# Patient Record
Sex: Female | Born: 1945 | Race: White | Hispanic: No | Marital: Married | Smoking: Never smoker
Health system: Southern US, Community
[De-identification: ages and names within clinical notes are randomized; demographics above are authoritative.]

## PROBLEM LIST (undated history)

## (undated) DIAGNOSIS — M8580 Other specified disorders of bone density and structure, unspecified site: Secondary | ICD-10-CM

## (undated) DIAGNOSIS — T7840XA Allergy, unspecified, initial encounter: Secondary | ICD-10-CM

## (undated) DIAGNOSIS — I10 Essential (primary) hypertension: Secondary | ICD-10-CM

## (undated) DIAGNOSIS — T8859XA Other complications of anesthesia, initial encounter: Secondary | ICD-10-CM

## (undated) DIAGNOSIS — E785 Hyperlipidemia, unspecified: Secondary | ICD-10-CM

## (undated) HISTORY — PX: OTHER SURGICAL HISTORY: SHX169

## (undated) HISTORY — PX: TUBAL LIGATION: SHX77

## (undated) HISTORY — PX: WISDOM TOOTH EXTRACTION: SHX21

## (undated) HISTORY — DX: Allergy, unspecified, initial encounter: T78.40XA

## (undated) HISTORY — PX: POLYPECTOMY: SHX149

## (undated) HISTORY — DX: Hyperlipidemia, unspecified: E78.5

## (undated) HISTORY — DX: Other specified disorders of bone density and structure, unspecified site: M85.80

## (undated) HISTORY — DX: Essential (primary) hypertension: I10

---

## 2004-05-29 ENCOUNTER — Encounter: Payer: Self-pay | Admitting: Family Medicine

## 2004-05-29 LAB — CONVERTED CEMR LAB

## 2005-01-02 ENCOUNTER — Ambulatory Visit: Payer: Self-pay | Admitting: Family Medicine

## 2005-06-05 ENCOUNTER — Ambulatory Visit: Payer: Self-pay | Admitting: Family Medicine

## 2005-06-12 ENCOUNTER — Ambulatory Visit: Payer: Self-pay | Admitting: Family Medicine

## 2005-06-12 ENCOUNTER — Other Ambulatory Visit: Admission: RE | Admit: 2005-06-12 | Discharge: 2005-06-12 | Payer: Self-pay | Admitting: Family Medicine

## 2005-07-03 ENCOUNTER — Encounter: Admission: RE | Admit: 2005-07-03 | Discharge: 2005-07-03 | Payer: Self-pay | Admitting: Family Medicine

## 2005-12-31 ENCOUNTER — Ambulatory Visit: Payer: Self-pay | Admitting: Family Medicine

## 2006-08-01 ENCOUNTER — Encounter (INDEPENDENT_AMBULATORY_CARE_PROVIDER_SITE_OTHER): Payer: Self-pay | Admitting: *Deleted

## 2006-08-01 ENCOUNTER — Ambulatory Visit: Payer: Self-pay | Admitting: Family Medicine

## 2006-08-01 ENCOUNTER — Other Ambulatory Visit: Admission: RE | Admit: 2006-08-01 | Discharge: 2006-08-01 | Payer: Self-pay | Admitting: Family Medicine

## 2006-08-13 ENCOUNTER — Encounter: Admission: RE | Admit: 2006-08-13 | Discharge: 2006-08-13 | Payer: Self-pay | Admitting: Family Medicine

## 2006-08-13 ENCOUNTER — Ambulatory Visit: Payer: Self-pay | Admitting: Family Medicine

## 2006-12-23 ENCOUNTER — Ambulatory Visit: Payer: Self-pay | Admitting: Family Medicine

## 2007-04-23 ENCOUNTER — Ambulatory Visit: Payer: Self-pay | Admitting: Family Medicine

## 2007-07-22 ENCOUNTER — Encounter: Payer: Self-pay | Admitting: Family Medicine

## 2007-07-22 DIAGNOSIS — I10 Essential (primary) hypertension: Secondary | ICD-10-CM

## 2007-08-11 ENCOUNTER — Ambulatory Visit: Payer: Self-pay | Admitting: Family Medicine

## 2007-08-11 LAB — CONVERTED CEMR LAB
Blood in Urine, dipstick: NEGATIVE
Glucose, Urine, Semiquant: NEGATIVE
Specific Gravity, Urine: 1.01
pH: 7

## 2007-08-13 LAB — CONVERTED CEMR LAB
AST: 32 units/L (ref 0–37)
Albumin: 4.3 g/dL (ref 3.5–5.2)
Basophils Absolute: 0.1 10*3/uL (ref 0.0–0.1)
Chloride: 96 meq/L (ref 96–112)
Direct LDL: 98.9 mg/dL
Eosinophils Absolute: 0.1 10*3/uL (ref 0.0–0.6)
GFR calc Af Amer: 132 mL/min
GFR calc non Af Amer: 109 mL/min
HCT: 42.1 % (ref 36.0–46.0)
HDL: 41.1 mg/dL (ref >39.0)
MCHC: 35 g/dL (ref 30.0–36.0)
MCV: 99.3 fL (ref 78.0–100.0)
Neutro Abs: 4.9 10*3/uL (ref 1.4–7.7)
Neutrophils Relative %: 62.4 % (ref 43.0–77.0)
Platelets: 277 10*3/uL (ref 150–400)
Potassium: 4.7 meq/L (ref 3.5–5.1)
RBC: 4.24 M/uL (ref 3.87–5.11)
Sodium: 135 meq/L (ref 135–145)
TSH: 1.5 microintl units/mL (ref 0.35–5.50)
Triglycerides: 365 mg/dL (ref 0–149)

## 2007-08-19 ENCOUNTER — Encounter: Payer: Self-pay | Admitting: Family Medicine

## 2007-08-19 ENCOUNTER — Other Ambulatory Visit: Admission: RE | Admit: 2007-08-19 | Discharge: 2007-08-19 | Payer: Self-pay | Admitting: Family Medicine

## 2007-08-19 ENCOUNTER — Ambulatory Visit: Payer: Self-pay | Admitting: Family Medicine

## 2007-08-29 ENCOUNTER — Ambulatory Visit: Payer: Self-pay | Admitting: Gastroenterology

## 2007-09-04 ENCOUNTER — Encounter: Admission: RE | Admit: 2007-09-04 | Discharge: 2007-09-04 | Payer: Self-pay | Admitting: Family Medicine

## 2007-09-12 ENCOUNTER — Encounter: Payer: Self-pay | Admitting: Family Medicine

## 2007-09-12 ENCOUNTER — Ambulatory Visit: Payer: Self-pay | Admitting: Gastroenterology

## 2007-11-26 ENCOUNTER — Ambulatory Visit: Payer: Self-pay | Admitting: Family Medicine

## 2007-11-26 LAB — CONVERTED CEMR LAB
Cholesterol: 174 mg/dL (ref 0–200)
HDL: 43.7 mg/dL (ref >39.0)
Triglycerides: 72 mg/dL (ref 0–149)

## 2008-03-31 ENCOUNTER — Telehealth: Payer: Self-pay | Admitting: Family Medicine

## 2008-04-02 ENCOUNTER — Ambulatory Visit: Payer: Self-pay | Admitting: Family Medicine

## 2008-05-20 ENCOUNTER — Telehealth: Payer: Self-pay | Admitting: *Deleted

## 2008-08-23 ENCOUNTER — Ambulatory Visit: Payer: Self-pay | Admitting: Family Medicine

## 2008-08-23 LAB — CONVERTED CEMR LAB
Blood in Urine, dipstick: NEGATIVE
Ketones, urine, test strip: NEGATIVE
Nitrite: NEGATIVE
Protein, U semiquant: NEGATIVE
Urobilinogen, UA: 0.2
WBC Urine, dipstick: NEGATIVE

## 2008-08-26 LAB — CONVERTED CEMR LAB
AST: 31 units/L (ref 0–37)
Albumin: 4.4 g/dL (ref 3.5–5.2)
Alkaline Phosphatase: 42 units/L (ref 39–117)
BUN: 10 mg/dL (ref 6–23)
Bilirubin, Direct: 0.1 mg/dL (ref 0.0–0.3)
Chloride: 102 meq/L (ref 96–112)
Eosinophils Absolute: 0.1 10*3/uL (ref 0.0–0.7)
Eosinophils Relative: 1.7 % (ref 0.0–5.0)
GFR calc Af Amer: 131 mL/min
GFR calc non Af Amer: 108 mL/min
Glucose, Bld: 99 mg/dL (ref 70–99)
HDL: 54.5 mg/dL (ref >39.0)
Monocytes Absolute: 0.4 10*3/uL (ref 0.1–1.0)
Monocytes Relative: 7.1 % (ref 3.0–12.0)
Neutrophils Relative %: 55.1 % (ref 43.0–77.0)
Platelets: 272 10*3/uL (ref 150–400)
Potassium: 3.9 meq/L (ref 3.5–5.1)
RDW: 11.8 % (ref 11.5–14.6)
Sodium: 142 meq/L (ref 135–145)
Total CHOL/HDL Ratio: 3.5
Triglycerides: 86 mg/dL (ref 0–149)
VLDL: 17 mg/dL (ref 0–40)
WBC: 6.3 10*3/uL (ref 4.5–10.5)

## 2008-08-30 ENCOUNTER — Ambulatory Visit: Payer: Self-pay | Admitting: Family Medicine

## 2008-08-30 ENCOUNTER — Encounter: Payer: Self-pay | Admitting: Family Medicine

## 2008-08-30 ENCOUNTER — Other Ambulatory Visit: Admission: RE | Admit: 2008-08-30 | Discharge: 2008-08-30 | Payer: Self-pay | Admitting: Family Medicine

## 2008-08-30 DIAGNOSIS — E785 Hyperlipidemia, unspecified: Secondary | ICD-10-CM

## 2008-09-06 ENCOUNTER — Encounter: Admission: RE | Admit: 2008-09-06 | Discharge: 2008-09-06 | Payer: Self-pay | Admitting: Family Medicine

## 2008-09-06 ENCOUNTER — Ambulatory Visit: Payer: Self-pay | Admitting: Internal Medicine

## 2008-09-06 ENCOUNTER — Encounter: Payer: Self-pay | Admitting: Family Medicine

## 2009-09-12 ENCOUNTER — Encounter: Admission: RE | Admit: 2009-09-12 | Discharge: 2009-09-12 | Payer: Self-pay | Admitting: Family Medicine

## 2009-10-20 ENCOUNTER — Encounter: Payer: Self-pay | Admitting: Family Medicine

## 2009-11-28 ENCOUNTER — Ambulatory Visit: Payer: Self-pay | Admitting: Family Medicine

## 2009-11-28 LAB — CONVERTED CEMR LAB
Bilirubin Urine: NEGATIVE
Ketones, urine, test strip: NEGATIVE
Nitrite: NEGATIVE
Protein, U semiquant: NEGATIVE
Urobilinogen, UA: 0.2

## 2009-11-29 LAB — CONVERTED CEMR LAB
ALT: 23 units/L (ref 0–35)
Alkaline Phosphatase: 36 units/L — ABNORMAL LOW (ref 39–117)
BUN: 14 mg/dL (ref 6–23)
Bilirubin, Direct: 0.2 mg/dL (ref 0.0–0.3)
Calcium: 9.2 mg/dL (ref 8.4–10.5)
Chloride: 103 meq/L (ref 96–112)
Cholesterol: 181 mg/dL (ref 0–200)
Creatinine, Ser: 0.7 mg/dL (ref 0.4–1.2)
Eosinophils Relative: 1.4 % (ref 0.0–5.0)
GFR calc non Af Amer: 90.28 mL/min (ref >60)
HDL: 60.8 mg/dL (ref >39.00)
LDL Cholesterol: 107 mg/dL — ABNORMAL HIGH (ref 0–99)
Lymphocytes Relative: 39.1 % (ref 12.0–46.0)
MCV: 99.1 fL (ref 78.0–100.0)
Monocytes Absolute: 0.4 10*3/uL (ref 0.1–1.0)
Neutrophils Relative %: 52.5 % (ref 43.0–77.0)
Platelets: 305 10*3/uL (ref 150.0–400.0)
RBC: 4.08 M/uL (ref 3.87–5.11)
Total Bilirubin: 0.8 mg/dL (ref 0.3–1.2)
Total CHOL/HDL Ratio: 3
Triglycerides: 68 mg/dL (ref 0.0–149.0)
VLDL: 13.6 mg/dL (ref 0.0–40.0)
WBC: 5.6 10*3/uL (ref 4.5–10.5)

## 2009-12-06 ENCOUNTER — Ambulatory Visit: Payer: Self-pay | Admitting: Family Medicine

## 2009-12-14 ENCOUNTER — Telehealth: Payer: Self-pay | Admitting: Family Medicine

## 2010-08-06 ENCOUNTER — Encounter: Payer: Self-pay | Admitting: Family Medicine

## 2010-08-14 ENCOUNTER — Encounter: Payer: Self-pay | Admitting: Family Medicine

## 2010-08-15 ENCOUNTER — Telehealth: Payer: Self-pay | Admitting: Family Medicine

## 2010-09-15 ENCOUNTER — Encounter: Admission: RE | Admit: 2010-09-15 | Discharge: 2010-09-15 | Payer: Self-pay | Admitting: Family Medicine

## 2010-11-25 ENCOUNTER — Encounter: Payer: Self-pay | Admitting: Family Medicine

## 2010-11-26 LAB — CONVERTED CEMR LAB: Pap Smear: NORMAL

## 2010-11-27 ENCOUNTER — Ambulatory Visit
Admission: RE | Admit: 2010-11-27 | Discharge: 2010-11-27 | Payer: Self-pay | Source: Home / Self Care | Attending: Family Medicine | Admitting: Family Medicine

## 2010-11-27 ENCOUNTER — Other Ambulatory Visit: Payer: Self-pay | Admitting: Family Medicine

## 2010-11-27 LAB — BASIC METABOLIC PANEL
BUN: 12 mg/dL (ref 6–23)
Calcium: 8.8 mg/dL (ref 8.4–10.5)
GFR: 78.26 mL/min (ref >60.00)
Glucose, Bld: 95 mg/dL (ref 70–99)
Sodium: 136 mEq/L (ref 135–145)

## 2010-11-27 LAB — LIPID PANEL
Cholesterol: 162 mg/dL (ref 0–200)
HDL: 54.8 mg/dL (ref >39.00)
LDL Cholesterol: 92 mg/dL (ref 0–99)
VLDL: 15.4 mg/dL (ref 0.0–40.0)

## 2010-11-27 LAB — CONVERTED CEMR LAB
Blood in Urine, dipstick: NEGATIVE
Ketones, urine, test strip: NEGATIVE
Nitrite: NEGATIVE
Protein, U semiquant: NEGATIVE
Specific Gravity, Urine: 1.02
Urobilinogen, UA: 0.2

## 2010-11-27 LAB — CBC WITH DIFFERENTIAL/PLATELET
Basophils Absolute: 0 10*3/uL (ref 0.0–0.1)
Lymphocytes Relative: 38.2 % (ref 12.0–46.0)
Monocytes Relative: 7.1 % (ref 3.0–12.0)
Platelets: 324 10*3/uL (ref 150.0–400.0)
RDW: 12.7 % (ref 11.5–14.6)
WBC: 5.9 10*3/uL (ref 4.5–10.5)

## 2010-11-27 LAB — TSH: TSH: 1.71 u[IU]/mL (ref 0.35–5.50)

## 2010-11-27 LAB — HEPATIC FUNCTION PANEL
AST: 21 U/L (ref 0–37)
Alkaline Phosphatase: 34 U/L — ABNORMAL LOW (ref 39–117)
Bilirubin, Direct: 0.1 mg/dL (ref 0.0–0.3)
Total Bilirubin: 0.8 mg/dL (ref 0.3–1.2)

## 2010-11-28 NOTE — Assessment & Plan Note (Signed)
Summary: CPX // RS   Vital Signs:  Patient profile:   65 year old female Height:      64.25 inches Weight:      146 pounds BMI:     24.96 Temp:     98.4 degrees F oral Pulse rate:   62 / minute BP sitting:   130 / 90  (left arm) Cuff size:   regular  Vitals Entered By: Alfred Levins, CMA (December 06, 2009 9:08 AM) CC: cpx, no pap   History of Present Illness: 65 yr old female for cpx. She feels fine and has no concerns. Still walks about 2 miles a day.   Current Medications (verified): 1)  Cozaar 25 Mg  Tabs (Losartan Potassium) .... Take 1 Tablet By Mouth Once A Day 2)  Hydrochlorothiazide 25 Mg  Tabs (Hydrochlorothiazide) .... Take 1 Tablet By Mouth Once A Day 3)  Fosamax 70 Mg  Tabs (Alendronate Sodium) .... Weekly 4)  Calcium 500 500 Mg  Tabs (Calcium Carbonate) .Marland Kitchen.. 1 By Mouth Once Daily 5)  Tricor 145 Mg Tabs (Fenofibrate) .Marland Kitchen.. 1 By Mouth Once Daily  Allergies (verified): No Known Drug Allergies  Past History:  Past Medical History: Hypertension Osteopenia (DEXA on 09-06-08) Hyperlipidemia sees Dr. Lucretia Roers for Dermatology exams  Past Surgical History: Reviewed history from 08/30/2008 and no changes required. colonoscopy 09-12-07 per Dr. Christella Hartigan, repeat in 5 yrs  Family History: Reviewed history from 08/19/2007 and no changes required. Family History Breast cancer 1st degree relative <50 Family History of Colon CA 1st degree relative <60 Family History Hypertension Family History Lung cancer Family History of Prostate CA 1st degree relative <50  Social History: Reviewed history from 08/19/2007 and no changes required. Married Never Smoked Alcohol use-yes Drug use-no  Review of Systems  The patient denies anorexia, fever, weight loss, weight gain, vision loss, decreased hearing, hoarseness, chest pain, syncope, dyspnea on exertion, peripheral edema, prolonged cough, headaches, hemoptysis, abdominal pain, melena, hematochezia, severe indigestion/heartburn,  hematuria, incontinence, genital sores, muscle weakness, suspicious skin lesions, transient blindness, difficulty walking, depression, unusual weight change, abnormal bleeding, enlarged lymph nodes, angioedema, breast masses, and testicular masses.    Physical Exam  General:  Well-developed,well-nourished,in no acute distress; alert,appropriate and cooperative throughout examination Head:  Normocephalic and atraumatic without obvious abnormalities. No apparent alopecia or balding. Eyes:  No corneal or conjunctival inflammation noted. EOMI. Perrla. Funduscopic exam benign, without hemorrhages, exudates or papilledema. Vision grossly normal. Ears:  External ear exam shows no significant lesions or deformities.  Otoscopic examination reveals clear canals, tympanic membranes are intact bilaterally without bulging, retraction, inflammation or discharge. Hearing is grossly normal bilaterally. Nose:  External nasal examination shows no deformity or inflammation. Nasal mucosa are pink and moist without lesions or exudates. Mouth:  Oral mucosa and oropharynx without lesions or exudates.  Teeth in good repair. Neck:  No deformities, masses, or tenderness noted. Chest Wall:  No deformities, masses, or tenderness noted. Breasts:  No mass, nodules, thickening, tenderness, bulging, retraction, inflamation, nipple discharge or skin changes noted.   Lungs:  Normal respiratory effort, chest expands symmetrically. Lungs are clear to auscultation, no crackles or wheezes. Heart:  Normal rate and regular rhythm. S1 and S2 normal without gallop, murmur, click, rub or other extra sounds. EKG normal except 1st degree AV block.  Abdomen:  Bowel sounds positive,abdomen soft and non-tender without masses, organomegaly or hernias noted. Msk:  No deformity or scoliosis noted of thoracic or lumbar spine.   Pulses:  R and L  carotid,radial,femoral,dorsalis pedis and posterior tibial pulses are full and equal  bilaterally Extremities:  No clubbing, cyanosis, edema, or deformity noted with normal full range of motion of all joints.   Neurologic:  No cranial nerve deficits noted. Station and gait are normal. Plantar reflexes are down-going bilaterally. DTRs are symmetrical throughout. Sensory, motor and coordinative functions appear intact. Skin:  Intact without suspicious lesions or rashes Cervical Nodes:  No lymphadenopathy noted Axillary Nodes:  No palpable lymphadenopathy Inguinal Nodes:  No significant adenopathy Psych:  Cognition and judgment appear intact. Alert and cooperative with normal attention span and concentration. No apparent delusions, illusions, hallucinations   Impression & Recommendations:  Problem # 1:  EXAMINATION, ROUTINE MEDICAL (ICD-V70.0)  Orders: EKG w/ Interpretation (93000)  Complete Medication List: 1)  Cozaar 25 Mg Tabs (Losartan potassium) .... Take 1 tablet by mouth once a day 2)  Hydrochlorothiazide 25 Mg Tabs (Hydrochlorothiazide) .... Take 1 tablet by mouth once a day 3)  Fosamax 70 Mg Tabs (Alendronate sodium) .... Weekly 4)  Calcium 500 500 Mg Tabs (Calcium carbonate) .Marland Kitchen.. 1 by mouth once daily 5)  Fenofibrate 160 Mg Tabs (Fenofibrate) .... Once daily  Patient Instructions: 1)  Please schedule a follow-up appointment in 6 months .  Prescriptions: FENOFIBRATE 160 MG TABS (FENOFIBRATE) once daily  #30 x 11   Entered and Authorized by:   Nelwyn Salisbury MD   Signed by:   Nelwyn Salisbury MD on 12/06/2009   Method used:   [facility]ically to        [facility] [phone][facility] (retail)       [address]       [place], [place]  [address]       Ph: [phone] or [phone]       Fax: [phone]   RxID:   [id number] FOSAMAX 70 MG  TABS (ALENDRONATE SODIUM) weekly  #4 x 11   Entered and Authorized by:   Nelwyn Salisbury MD   Signed by:   Nelwyn Salisbury MD on 12/06/2009   Method used:   Electronically to        [facility] [phone]* (retail)       [address]       [place], [place]  [address]       Ph: [phone] or [phone]       Fax: [phone]   RxID:   [id number] HYDROCHLOROTHIAZIDE 25 MG  TABS (HYDROCHLOROTHIAZIDE) Take 1 tablet by mouth once a day  #30 x 11   Entered and Authorized by:   Nelwyn Salisbury MD   Signed by:   Nelwyn Salisbury MD on 12/06/2009   Method used:   Electronically to        [facility] [phone]* (retail)       [address]       [place], [place]  [address]       Ph: [phone] or [phone]       Fax: [phone]   RxID:   [id number] COZAAR 25 MG  TABS (LOSARTAN POTASSIUM) Take 1 tablet by mouth once a day  #30 x 11   Entered and Authorized by:   Nelwyn Salisbury MD   Signed by:   Nelwyn Salisbury MD on 12/06/2009   Method used:   [facility]        [facility] [address] [phone][facility] ([facility]       [address]  [place], [place]  [address]       Ph: [phone] or [phone]       Fax: [phone]   RxID:   [id number]   Preventive Care Screening  Mammogram:    Date:  06/29/2009    Next Due:  08/2010    Results:  normal   Pap Smear:    Date:  08/30/2008    Results:  normal

## 2010-11-28 NOTE — Miscellaneous (Signed)
Summary: Flu Shot[facility]  Flu Shot/[facility]   Imported By: Maryln Gottron 08/15/2010 14:28:13  _____________________________________________________________________  External Attachment:    Type:   Image     Comment:   External Document

## 2010-11-28 NOTE — Progress Notes (Signed)
Summary: requesting rx  Phone Note Call from Patient Call back at Home Phone [phone]   Caller: Patient---live call Summary of Call: Going to [place]. Would like a mild sedative to sleep while flying long distance. [facility] call [facility]---[phone]. Initial call taken by: Warnell Forester,  August 15, 2010 3:34 PM  Follow-up for Phone Call        call in Lorazepam 1 mg, take 1 q 4 hours as needed anxiety, #60 with no rf Follow-up by: Nelwyn Salisbury MD,  August 15, 2010 4:53 PM  Additional Follow-up for Phone Call Additional follow up Details #1::        done  Additional Follow-up by: Pura Spice, RN,  August 15, 2010 5:03 PM    New/Updated Medications: LORAZEPAM 1 MG TABS (LORAZEPAM) 1 by mouth every 4 hrs as needed anxiety Prescriptions: LORAZEPAM 1 MG TABS (LORAZEPAM) 1 by mouth every 4 hrs as needed anxiety  #60 x 0   Entered by:   Pura Spice, RN   Authorized by:   Nelwyn Salisbury MD   Signed by:   Pura Spice, RN on 08/15/2010   Method used:   Telephoned to ...       [facility] [address] (retail)       [address]       [place], [place]  [address]       Ph: [phone] or [phone]       Fax: [phone]   RxID:   [id number]

## 2010-11-28 NOTE — Miscellaneous (Signed)
Summary: flu inj at [facility]    Clinical Lists Changes  Observations: Added new observation of FLU VAX: Historical (08/06/2010 11:48)      Immunization History:  Influenza Immunization History:    Influenza:  historical (08/06/2010) received at [facility]  lot number [id number] rn..........Marland Kitchen

## 2010-11-28 NOTE — Progress Notes (Signed)
Summary: Rx cold sores  Phone Note Call from Patient   Caller: Patient Call For: Nelwyn Salisbury MD Summary of Call: going to [place], requesting Rx for cold sores- she uses sun block but still gets blisters. [facility] at work phone [phone] Initial call taken by: Raechel Ache, RN,  December 14, 2009 9:21 AM  Follow-up for Phone Call        call in Valtrex 500 mg two times a day as needed cold sores, #60 with 5 rf Follow-up by: Nelwyn Salisbury MD,  December 14, 2009 12:53 PM    New/Updated Medications: VALTREX 500 MG TABS (VALACYCLOVIR HCL) 1 two times a day as needed cold sores Prescriptions: VALTREX 500 MG TABS (VALACYCLOVIR HCL) 1 two times a day as needed cold sores  #60 x 5   Entered by:   Raechel Ache, RN   Authorized by:   Nelwyn Salisbury MD   Signed by:   Raechel Ache, RN on 12/14/2009   Method used:   Electronically to        [facility] [phone][facility] (retail)       [address]       [place], [place]  [address]       Ph: [phone] or [phone]       Fax: [phone]   RxID:   [id number]

## 2010-12-01 ENCOUNTER — Telehealth: Payer: Self-pay

## 2010-12-01 NOTE — Telephone Encounter (Signed)
Pt aware.

## 2010-12-01 NOTE — Telephone Encounter (Signed)
Message copied by Madison Hickman on Fri Dec 01, 2010  4:40 PM ------      Message from: Dwaine Deter      Created: Caleen Essex Dec 01, 2010  4:08 PM       normal

## 2010-12-10 ENCOUNTER — Other Ambulatory Visit: Payer: Self-pay | Admitting: Family Medicine

## 2010-12-10 DIAGNOSIS — I10 Essential (primary) hypertension: Secondary | ICD-10-CM

## 2010-12-11 ENCOUNTER — Other Ambulatory Visit (HOSPITAL_COMMUNITY)
Admission: RE | Admit: 2010-12-11 | Discharge: 2010-12-11 | Disposition: A | Discharge status: Home / Self Care | Payer: PRIVATE HEALTH INSURANCE | Source: Ambulatory Visit | Attending: Family Medicine | Admitting: Family Medicine

## 2010-12-11 ENCOUNTER — Ambulatory Visit (INDEPENDENT_AMBULATORY_CARE_PROVIDER_SITE_OTHER): Payer: PRIVATE HEALTH INSURANCE | Admitting: Family Medicine

## 2010-12-11 ENCOUNTER — Encounter: Payer: Self-pay | Admitting: Family Medicine

## 2010-12-11 VITALS — BP 126/84 | HR 80 | Temp 98.2°F | Ht 65.0 in | Wt 144.0 lb

## 2010-12-11 DIAGNOSIS — Z0000 Encounter for general adult medical examination without abnormal findings: Secondary | ICD-10-CM

## 2010-12-11 DIAGNOSIS — Z01419 Encounter for gynecological examination (general) (routine) without abnormal findings: Secondary | ICD-10-CM | POA: Insufficient documentation

## 2010-12-11 DIAGNOSIS — I10 Essential (primary) hypertension: Secondary | ICD-10-CM

## 2010-12-11 MED ORDER — HYDROCHLOROTHIAZIDE 25 MG PO TABS: 25.0000 mg | ORAL_TABLET | Freq: Every day | ORAL | Status: DC

## 2010-12-11 MED ORDER — ALENDRONATE SODIUM 70 MG PO TABS: 70.0000 mg | ORAL_TABLET | ORAL | Status: DC

## 2010-12-11 MED ORDER — LOSARTAN POTASSIUM 25 MG PO TABS: 25.0000 mg | ORAL_TABLET | Freq: Every day | ORAL | Status: DC

## 2010-12-11 MED ORDER — FENOFIBRATE 160 MG PO TABS: 160.0000 mg | ORAL_TABLET | Freq: Every day | ORAL | Status: DC

## 2010-12-11 NOTE — Progress Notes (Signed)
Subjective:    Patient ID: Monique [name], female    DOB: April 27, 1946, 65 y.o.   MRN: [id number]  HPI 65 yr old female for a cpx. Doing well with no complaints.    Review of Systems  Constitutional: Negative.  Negative for fever, diaphoresis, activity change, appetite change, fatigue and unexpected weight change.  HENT: Negative.  Negative for hearing loss, ear pain, nosebleeds, congestion, sore throat, trouble swallowing, neck pain, neck stiffness, voice change and tinnitus.   Eyes: Negative.  Negative for photophobia, pain, discharge, redness and visual disturbance.  Respiratory: Negative.  Negative for apnea, cough, choking, chest tightness, shortness of breath, wheezing and stridor.   Cardiovascular: Negative.  Negative for chest pain, palpitations and leg swelling.  Gastrointestinal: Negative.  Negative for nausea, vomiting, abdominal pain, diarrhea, constipation, blood in stool, abdominal distention and rectal pain.  Genitourinary: Negative.  Negative for dysuria, urgency, frequency, flank pain, scrotal swelling, enuresis, difficulty urinating and testicular pain.  Musculoskeletal: Negative.  Negative for myalgias, back pain, joint swelling, arthralgias and gait problem.  Skin: Negative.  Negative for color change, pallor, rash and wound.  Neurological: Negative.  Negative for dizziness, tremors, seizures, syncope, speech difficulty, weakness, light-headedness, numbness and headaches.  Hematological: Negative.  Negative for adenopathy. Does not bruise/bleed easily.  Psychiatric/Behavioral: Negative.  Negative for hallucinations, behavioral problems, confusion, sleep disturbance, dysphoric mood and agitation. The patient is not nervous/anxious.        Objective:   Physical Exam  Constitutional: She appears well-developed and well-nourished. No distress.  HENT:  Head: Normocephalic and atraumatic.  Right Ear: External ear normal.  Left Ear: External ear normal.  Nose: Nose normal.    Mouth/Throat: Oropharynx is clear and moist. No oropharyngeal exudate.  Eyes: Conjunctivae and EOM are normal. Pupils are equal, round, and reactive to light. Right eye exhibits no discharge. Left eye exhibits no discharge. No scleral icterus.  Neck: Normal range of motion. Neck supple. No JVD present. No thyromegaly present.  Cardiovascular: Normal rate, regular rhythm, normal heart sounds and intact distal pulses.  Exam reveals no gallop and no friction rub.   No murmur heard.      EKG normal   Pulmonary/Chest: Effort normal and breath sounds normal. No stridor. No respiratory distress. She has no wheezes. She has no rales. She exhibits no tenderness.  Abdominal: Soft. Normal appearance and bowel sounds are normal. She exhibits no distension, no abdominal bruit, no ascites and no mass. There is no hepatosplenomegaly. There is no tenderness. There is no rigidity, no rebound and no guarding. No hernia.  Genitourinary: Rectum normal, vagina normal and uterus normal. Rectal exam shows no mass and no tenderness. Guaiac negative stool. No breast swelling, tenderness, discharge or bleeding. Cervix exhibits no motion tenderness, no discharge and no friability. Right adnexum displays no mass, no tenderness and no fullness. Left adnexum displays no mass, no tenderness and no fullness. No erythema around the vagina. No vaginal discharge found.  Musculoskeletal: Normal range of motion. She exhibits no edema and no tenderness.  Lymphadenopathy:    She has no cervical adenopathy.  Neurological: She is alert. She has normal reflexes. No cranial nerve deficit. She exhibits normal muscle tone. Coordination normal.  Skin: Skin is warm and dry. No rash noted. She is not diaphoretic. No erythema. No pallor.  Psychiatric: She has a normal mood and affect. Her behavior is normal. Judgment and thought content normal.          Assessment & Plan:  Continue diet  and exercise

## 2010-12-14 ENCOUNTER — Telehealth: Payer: Self-pay

## 2010-12-14 NOTE — Telephone Encounter (Signed)
Letter mailed for pap results

## 2010-12-14 NOTE — Progress Notes (Signed)
Letter mailed to pt regarding pap results

## 2011-01-01 ENCOUNTER — Other Ambulatory Visit: Payer: Self-pay | Admitting: Family Medicine

## 2011-01-30 ENCOUNTER — Telehealth: Payer: Self-pay | Admitting: Family Medicine

## 2011-01-30 NOTE — Telephone Encounter (Signed)
Monique [name]  Requests the name and phone number of your Eye Specialist.

## 2011-01-31 NOTE — Telephone Encounter (Signed)
Dr. Blima Ledger at [facility] at [phone]

## 2011-01-31 NOTE — Telephone Encounter (Signed)
Pt. Notified.

## 2011-03-16 NOTE — Assessment & Plan Note (Signed)
[facility]                              [facility] OFFICE NOTE   NAME:Monique [name]                         MRN:          [id number]  DATE:08/01/2006                            DOB:          1946-07-20   This is a 65 year old woman here for a complete physical examination.  In  general, she is doing well and has no complaints at all.  She does check her  blood pressure periodically and it has been doing well.  She is asking about  a bone density test since she has never had one.  She does take calcium and  vitamin D on a daily basis.  For details of her past medical history, family  history, social history, Monique [name], refer to her last physical note dated  June 12, 2005.   ALLERGIES:  None.   CURRENT MEDICATIONS:  1. Cozaar 25 mg per day.  2. Hydrochlorothiazide 25 mg per day.  3. Tenormin 100 mg per day.   OBJECTIVE:  Height 5 feet 4 inches, weight 143, BP 122/70, pulse 64 and  regular.  GENERAL:  She appears to be doing well.  SKIN:  Clear.  EYES:  Clear.  PHARYNX:  Clear.  NECK:  Supple without lymphadenopathy or masses.  LUNGS:  Clear.  CARDIAC:  Regular rate and rhythm without gallops, murmurs, or rubs.  Distal  pulses full.  EKG is within normal limits.  Breasts and axilla are clear.  ABDOMEN:  Soft.  Normal bowel sounds.  Nontender.  No masses.  PELVIC:  External genitalia within normal limits.  Vagina is clear.  Cervix  is clear.  Pap smear is obtained.  Uterus not enlarged.  Adnexa no mass or  tenderness.  RECTAL:  No mass or tenderness.  Stool Hemoccult negative.  EXTREMITIES:  No cyanosis, clubbing, or edema.  NEUROLOGIC:  Grossly intact.   ASSESSMENT AND PLAN:  1. Complete physical.  She is fasting so we will get the usual      laboratories.  I remind her to set up her usual mammogram.  She was      given a flu shot today and will set up her first      screening DEXA evaluation.  2. Hypertension, stable.      ______________________________  Tera Mater. Clent Ridges, MD   SAF/MedQ DD:  08/05/2006 DT:  08/05/2006 Job #:  [id number]

## 2011-05-11 ENCOUNTER — Other Ambulatory Visit: Payer: Self-pay | Admitting: Family Medicine

## 2011-07-26 ENCOUNTER — Other Ambulatory Visit: Payer: Self-pay | Admitting: *Deleted

## 2011-07-26 NOTE — Telephone Encounter (Signed)
Refill on lorazepam 1 mg last filled on 08/15/10 #60

## 2011-07-27 MED ORDER — LORAZEPAM 1 MG PO TABS: 1.0000 mg | ORAL_TABLET | Freq: Four times a day (QID) | ORAL | Status: DC | PRN

## 2011-07-27 NOTE — Telephone Encounter (Signed)
rx called into pharmacy

## 2011-07-27 NOTE — Telephone Encounter (Signed)
Call in #60 with 5 rf 

## 2011-07-28 ENCOUNTER — Other Ambulatory Visit: Payer: Self-pay | Admitting: Family Medicine

## 2011-07-31 NOTE — Telephone Encounter (Signed)
Script sent e-scribe 

## 2011-10-03 ENCOUNTER — Other Ambulatory Visit: Payer: Self-pay | Admitting: Family Medicine

## 2011-10-03 DIAGNOSIS — Z1231 Encounter for screening mammogram for malignant neoplasm of breast: Secondary | ICD-10-CM

## 2011-10-09 ENCOUNTER — Ambulatory Visit
Admission: RE | Admit: 2011-10-09 | Discharge: 2011-10-09 | Disposition: A | Discharge status: Home / Self Care | Payer: PRIVATE HEALTH INSURANCE | Source: Ambulatory Visit | Attending: Family Medicine | Admitting: Family Medicine

## 2011-10-09 DIAGNOSIS — Z1231 Encounter for screening mammogram for malignant neoplasm of breast: Secondary | ICD-10-CM

## 2012-01-28 ENCOUNTER — Telehealth: Payer: Self-pay | Admitting: Family Medicine

## 2012-01-28 DIAGNOSIS — Z0000 Encounter for general adult medical examination without abnormal findings: Secondary | ICD-10-CM

## 2012-01-28 MED ORDER — LOSARTAN POTASSIUM 25 MG PO TABS: 25.0000 mg | ORAL_TABLET | Freq: Every day | ORAL | Status: DC

## 2012-01-28 MED ORDER — HYDROCHLOROTHIAZIDE 25 MG PO TABS: 25.0000 mg | ORAL_TABLET | Freq: Every day | ORAL | Status: DC

## 2012-01-28 MED ORDER — FENOFIBRATE 160 MG PO TABS: 160.0000 mg | ORAL_TABLET | Freq: Every day | ORAL | Status: DC

## 2012-01-28 NOTE — Telephone Encounter (Signed)
Pt will be in [place]y 15 for a CPX. It is out of meds and is needing a refill to get her to her scheduled appt. Pt requesting a refill on fenofibrate 160 MG tablet,  losartan (COZAAR) 25 MG tablet and hydrochlorothiazide 25 MG tablet

## 2012-01-28 NOTE — Telephone Encounter (Signed)
[facility] refill, enough till pt can get in for office visit, I spoke with pt also.

## 2012-02-29 ENCOUNTER — Other Ambulatory Visit (INDEPENDENT_AMBULATORY_CARE_PROVIDER_SITE_OTHER): Payer: PRIVATE HEALTH INSURANCE

## 2012-02-29 DIAGNOSIS — Z0000 Encounter for general adult medical examination without abnormal findings: Secondary | ICD-10-CM

## 2012-02-29 LAB — CBC WITH DIFFERENTIAL/PLATELET
Basophils Relative: 0.6 % (ref 0.0–3.0)
Eosinophils Absolute: 0.1 10*3/uL (ref 0.0–0.7)
Eosinophils Relative: 2.5 % (ref 0.0–5.0)
Lymphocytes Relative: 39.3 % (ref 12.0–46.0)
MCHC: 34.4 g/dL (ref 30.0–36.0)
Neutrophils Relative %: 51.9 % (ref 43.0–77.0)
RBC: 4.31 Mil/uL (ref 3.87–5.11)
WBC: 5.5 10*3/uL (ref 4.5–10.5)

## 2012-02-29 LAB — POCT URINALYSIS DIPSTICK
Blood, UA: NEGATIVE
Glucose, UA: NEGATIVE
Nitrite, UA: NEGATIVE
Protein, UA: NEGATIVE
Urobilinogen, UA: 1
pH, UA: 8.5

## 2012-02-29 LAB — HEPATIC FUNCTION PANEL
Alkaline Phosphatase: 38 U/L — ABNORMAL LOW (ref 39–117)
Bilirubin, Direct: 0.1 mg/dL (ref 0.0–0.3)
Total Protein: 7.1 g/dL (ref 6.0–8.3)

## 2012-02-29 LAB — BASIC METABOLIC PANEL
Calcium: 9.5 mg/dL (ref 8.4–10.5)
Creatinine, Ser: 0.6 mg/dL (ref 0.4–1.2)

## 2012-02-29 LAB — LIPID PANEL
HDL: 56.7 mg/dL (ref >39.00)
LDL Cholesterol: 102 mg/dL — ABNORMAL HIGH (ref 0–99)
Total CHOL/HDL Ratio: 3

## 2012-02-29 LAB — TSH: TSH: 1.7 u[IU]/mL (ref 0.35–5.50)

## 2012-03-05 NOTE — Progress Notes (Signed)
Quick Note:  Left voice message with normal results. ______ 

## 2012-03-11 ENCOUNTER — Encounter: Payer: Self-pay | Admitting: Family Medicine

## 2012-03-12 ENCOUNTER — Ambulatory Visit (INDEPENDENT_AMBULATORY_CARE_PROVIDER_SITE_OTHER): Payer: PRIVATE HEALTH INSURANCE | Admitting: Family Medicine

## 2012-03-12 ENCOUNTER — Encounter: Payer: Self-pay | Admitting: Family Medicine

## 2012-03-12 VITALS — BP 126/68 | HR 64 | Temp 98.4°F | Ht 64.5 in | Wt 144.0 lb

## 2012-03-12 DIAGNOSIS — Z23 Encounter for immunization: Secondary | ICD-10-CM

## 2012-03-12 DIAGNOSIS — Z0000 Encounter for general adult medical examination without abnormal findings: Secondary | ICD-10-CM

## 2012-03-12 MED ORDER — LOSARTAN POTASSIUM 25 MG PO TABS: 25.0000 mg | ORAL_TABLET | Freq: Every day | ORAL | Status: DC

## 2012-03-12 MED ORDER — FENOFIBRATE 160 MG PO TABS: 160.0000 mg | ORAL_TABLET | Freq: Every day | ORAL | Status: DC

## 2012-03-12 MED ORDER — ALENDRONATE SODIUM 70 MG PO TABS: 70.0000 mg | ORAL_TABLET | ORAL | Status: DC

## 2012-03-12 MED ORDER — DICLOFENAC SODIUM 75 MG PO TBEC: 75.0000 mg | DELAYED_RELEASE_TABLET | Freq: Two times a day (BID) | ORAL | Status: DC

## 2012-03-12 MED ORDER — HYDROCHLOROTHIAZIDE 25 MG PO TABS: 25.0000 mg | ORAL_TABLET | Freq: Every day | ORAL | Status: DC

## 2012-03-12 NOTE — Progress Notes (Signed)
  Subjective:    Patient ID: Monique [name], female    DOB: 02-Oct-1946, 66 y.o.   MRN: [id number]  HPI 66 yr old female for a cpx. She feels well except for several months of pain in the left hip. No traua hx. She does work out at her gym 3 days a week. Advil does not help much.    Review of Systems  Constitutional: Negative.   HENT: Negative.   Eyes: Negative.   Respiratory: Negative.   Cardiovascular: Negative.   Gastrointestinal: Negative.   Genitourinary: Negative for dysuria, urgency, frequency, hematuria, flank pain, decreased urine volume, enuresis, difficulty urinating, pelvic pain and dyspareunia.  Musculoskeletal: Negative.   Skin: Negative.   Neurological: Negative.   Hematological: Negative.   Psychiatric/Behavioral: Negative.        Objective:   Physical Exam  Constitutional: She is oriented to person, place, and time. She appears well-developed and well-nourished. No distress.  HENT:  Head: Normocephalic and atraumatic.  Right Ear: External ear normal.  Left Ear: External ear normal.  Nose: Nose normal.  Mouth/Throat: Oropharynx is clear and moist. No oropharyngeal exudate.  Eyes: Conjunctivae and EOM are normal. Pupils are equal, round, and reactive to light. No scleral icterus.  Neck: Normal range of motion. Neck supple. No JVD present. No thyromegaly present.  Cardiovascular: Normal rate, regular rhythm, normal heart sounds and intact distal pulses.  Exam reveals no gallop and no friction rub.   No murmur heard.      EKG normal   Pulmonary/Chest: Effort normal and breath sounds normal. No respiratory distress. She has no wheezes. She has no rales. She exhibits no tenderness.  Abdominal: Soft. Bowel sounds are normal. She exhibits no distension and no mass. There is no tenderness. There is no rebound and no guarding.  Musculoskeletal: Normal range of motion. She exhibits no edema and no tenderness.  Lymphadenopathy:    She has no cervical adenopathy.    Neurological: She is alert and oriented to person, place, and time. She has normal reflexes. No cranial nerve deficit. She exhibits normal muscle tone. Coordination normal.  Skin: Skin is warm and dry. No rash noted. No erythema.  Psychiatric: She has a normal mood and affect. Her behavior is normal. Judgment and thought content normal.          Assessment & Plan:  Well exam. Given a pneumonia shot. She is due for another colonoscopy this winter.

## 2012-03-12 NOTE — Progress Notes (Signed)
Addended by: Aniceto Boss A on: 03/12/2012 11:14 AM   Modules accepted: Orders

## 2012-04-29 ENCOUNTER — Other Ambulatory Visit: Payer: Self-pay | Admitting: Family Medicine

## 2012-04-29 NOTE — Telephone Encounter (Signed)
I left voice message for pt to return my call. I sent in script for a 90 day supply in May 2013. I need to know if pt is getting this through mail order or just local?

## 2012-04-30 NOTE — Telephone Encounter (Signed)
I left another voice mail with below message. She can either call the office to clarify or notify the pharmacy.

## 2012-05-05 ENCOUNTER — Telehealth: Payer: Self-pay | Admitting: Family Medicine

## 2012-05-05 NOTE — Telephone Encounter (Signed)
Pt left voice message, she gets all of her scripts from mail order.

## 2012-08-19 ENCOUNTER — Other Ambulatory Visit: Payer: Self-pay | Admitting: Family Medicine

## 2012-08-29 ENCOUNTER — Encounter: Payer: Self-pay | Admitting: Gastroenterology

## 2012-09-10 ENCOUNTER — Other Ambulatory Visit: Payer: Self-pay | Admitting: Family Medicine

## 2012-09-10 DIAGNOSIS — Z1231 Encounter for screening mammogram for malignant neoplasm of breast: Secondary | ICD-10-CM

## 2012-10-14 ENCOUNTER — Ambulatory Visit: Payer: PRIVATE HEALTH INSURANCE

## 2012-10-14 ENCOUNTER — Ambulatory Visit (INDEPENDENT_AMBULATORY_CARE_PROVIDER_SITE_OTHER): Payer: PRIVATE HEALTH INSURANCE

## 2012-10-14 DIAGNOSIS — Z1231 Encounter for screening mammogram for malignant neoplasm of breast: Secondary | ICD-10-CM

## 2012-10-31 ENCOUNTER — Encounter: Payer: Self-pay | Admitting: Gastroenterology

## 2012-12-10 ENCOUNTER — Ambulatory Visit (AMBULATORY_SURGERY_CENTER): Payer: PRIVATE HEALTH INSURANCE | Admitting: *Deleted

## 2012-12-10 VITALS — Ht 65.0 in | Wt 142.6 lb

## 2012-12-10 DIAGNOSIS — Z800 Family history of malignant neoplasm of digestive organs: Secondary | ICD-10-CM

## 2012-12-10 DIAGNOSIS — Z1211 Encounter for screening for malignant neoplasm of colon: Secondary | ICD-10-CM

## 2012-12-10 MED ORDER — MOVIPREP 100 G PO SOLR: 1.0000 | Freq: Once | ORAL | Status: DC

## 2012-12-10 NOTE — Progress Notes (Signed)
No egg or soy allergy. ewm No problems with sedation or intubation. ewm  Previous colonoscopy in 2008 per Dr Christella Hartigan

## 2012-12-24 ENCOUNTER — Ambulatory Visit (AMBULATORY_SURGERY_CENTER): Payer: PRIVATE HEALTH INSURANCE | Admitting: Gastroenterology

## 2012-12-24 ENCOUNTER — Encounter: Payer: Self-pay | Admitting: Gastroenterology

## 2012-12-24 VITALS — BP 115/65 | HR 53 | Temp 97.4°F | Resp 14 | Ht 65.0 in | Wt 142.0 lb

## 2012-12-24 DIAGNOSIS — Z800 Family history of malignant neoplasm of digestive organs: Secondary | ICD-10-CM

## 2012-12-24 DIAGNOSIS — K644 Residual hemorrhoidal skin tags: Secondary | ICD-10-CM

## 2012-12-24 DIAGNOSIS — D126 Benign neoplasm of colon, unspecified: Secondary | ICD-10-CM

## 2012-12-24 DIAGNOSIS — Z1211 Encounter for screening for malignant neoplasm of colon: Secondary | ICD-10-CM

## 2012-12-24 MED ORDER — SODIUM CHLORIDE 0.9 % IV SOLN: 500.0000 mL | INTRAVENOUS | Status: DC

## 2012-12-24 NOTE — Progress Notes (Signed)
1610 patient awakened and briefly watched procedure, denies discomfort.Patient then returned to sleep

## 2012-12-24 NOTE — Patient Instructions (Addendum)
Discharge instructions given with verbal understanding. Handouts on polyps and hemorrhoids given. Resume previous medications. YOU HAD AN ENDOSCOPIC PROCEDURE TODAY AT THE [facility]: Refer to the procedure report that was given to you for any specific questions about what was found during the examination.  If the procedure report does not answer your questions, please call your gastroenterologist to clarify.  If you requested that your care partner not be given the details of your procedure findings, then the procedure report has been included in a sealed envelope for you to review at your convenience later.  YOU SHOULD EXPECT: Some feelings of bloating in the abdomen. Passage of more gas than usual.  Walking can help get rid of the air that was put into your GI tract during the procedure and reduce the bloating. If you had a lower endoscopy (such as a colonoscopy or flexible sigmoidoscopy) you may notice spotting of blood in your stool or on the toilet paper. If you underwent a bowel prep for your procedure, then you may not have a normal bowel movement for a few days.  DIET: Your first meal following the procedure should be a light meal and then it is ok to progress to your normal diet.  A half-sandwich or bowl of soup is an example of a good first meal.  Heavy or fried foods are harder to digest and may make you feel nauseous or bloated.  Likewise meals heavy in dairy and vegetables can cause extra gas to form and this can also increase the bloating.  Drink plenty of fluids but you should avoid alcoholic beverages for 24 hours.  ACTIVITY: Your care partner should take you home directly after the procedure.  You should plan to take it easy, moving slowly for the rest of the day.  You can resume normal activity the day after the procedure however you should NOT DRIVE or use heavy machinery for 24 hours (because of the sedation medicines used during the test).    SYMPTOMS TO REPORT  IMMEDIATELY: A gastroenterologist can be reached at any hour.  During normal business hours, 8:30 AM to 5:00 PM Monday through Friday, call [phone].  After hours and on weekends, please call the GI answering service at [phone] who will take a message and have the physician on call contact you.   Following lower endoscopy (colonoscopy or flexible sigmoidoscopy):  Excessive amounts of blood in the stool  Significant tenderness or worsening of abdominal pains  Swelling of the abdomen that is new, acute  Fever of 100F or higher FOLLOW UP: If any biopsies were taken you will be contacted by phone or by letter within the next 1-3 weeks.  Call your gastroenterologist if you have not heard about the biopsies in 3 weeks.  Our staff will call the home number listed on your records the next business day following your procedure to check on you and address any questions or concerns that you may have at that time regarding the information given to you following your procedure. This is a courtesy call and so if there is no answer at the home number and we have not heard from you through the emergency physician on call, we will assume that you have returned to your regular daily activities without incident.  SIGNATURES/CONFIDENTIALITY: You and/or your care partner have signed paperwork which will be entered into your electronic medical record.  These signatures attest to the fact that that the information above on your After Visit Summary   has been reviewed and is understood.  Full responsibility of the confidentiality of this discharge information lies with you and/or your care-partner.  

## 2012-12-24 NOTE — Progress Notes (Signed)
Patient did not experience any of the following events: a burn prior to discharge; a fall within the facility; wrong site/side/patient/procedure/implant event; or a hospital transfer or hospital admission upon discharge from the facility. (G8907) Patient did not have preoperative order for IV antibiotic SSI prophylaxis. (G8918)  

## 2012-12-24 NOTE — Op Note (Signed)
[facility] [address] [place], [address]   COLONOSCOPY PROCEDURE REPORT  PATIENT: Monique [name], Monique [name]  MR#: [id number] BIRTHDATE: [birth year] , 66  yrs. old GENDER: Female ENDOSCOPIST: Rachael Fee, MD PROCEDURE DATE:  12/24/2012 PROCEDURE:   Colonoscopy with snare polypectomy ASA CLASS:   Class III INDICATIONS:elevated risk screening, mother had colon cancer MEDICATIONS: Fentanyl 75 mcg IV, Versed 7 mg IV, and These medications were titrated to patient response per physician's verbal order  DESCRIPTION OF PROCEDURE:   After the risks benefits and alternatives of the procedure were thoroughly explained, informed consent was obtained.  A digital rectal exam revealed no abnormalities of the rectum.   The LB PCF-H180AL [id number]  endoscope was introduced through the anus and advanced to the cecum, which was identified by both the appendix and ileocecal valve. No adverse events experienced.   The quality of the prep was good, using MoviPrep  The instrument was then slowly withdrawn as the colon was fully examined.   COLON FINDINGS: One polyp was found, removed and sent to pathology. This was sessiel, 4mm across, located in ascending colon, removed with cold snare, sent to pathology.  There were small hemorrhoids, externally.  The examination was otherwise normal.  Retroflexed views revealed no abnormalities. The time to cecum=5 minutes 55 seconds.  Withdrawal time=12 minutes 18 seconds.  The scope was withdrawn and the procedure completed. COMPLICATIONS: There were no complications.  ENDOSCOPIC IMPRESSION: One polyp was found, removed and sent to pathology. There were small hemorrhoids, externally. The examination was otherwise normal.  RECOMMENDATIONS: Given your significant family history of colon cancer, you should have a repeat colonoscopy in 5 years even if the polyp removed today is not precancerous.  You will receive a letter within 1-2 weeks with the  results of your biopsy as well as final recommendations.  Please call my office if you have not received a letter after 3 weeks.   eSigned:  Rachael Fee, MD 12/24/2012 9:22 AM   cc: Gershon Crane, MD

## 2012-12-25 ENCOUNTER — Telehealth: Payer: Self-pay

## 2012-12-25 NOTE — Telephone Encounter (Signed)
  Follow up Call-  Call back number 12/24/2012  Post procedure Call Back phone  # [phone]  Permission to leave phone message Yes     Patient questions:  Do you have a fever, pain , or abdominal swelling? no Pain Score  0 *  Have you tolerated food without any problems? yes  Have you been able to return to your normal activities? yes  Do you have any questions about your discharge instructions: Diet   no Medications  no Follow up visit  no  Do you have questions or concerns about your Care? no  Actions: * If pain score is 4 or above: No action needed, pain <4.

## 2012-12-30 ENCOUNTER — Encounter: Payer: Self-pay | Admitting: Gastroenterology

## 2013-01-22 ENCOUNTER — Other Ambulatory Visit: Payer: Self-pay | Admitting: Family Medicine

## 2013-03-15 ENCOUNTER — Other Ambulatory Visit: Payer: Self-pay | Admitting: Family Medicine

## 2013-03-16 ENCOUNTER — Telehealth: Payer: Self-pay | Admitting: Family Medicine

## 2013-03-16 DIAGNOSIS — Z0000 Encounter for general adult medical examination without abnormal findings: Secondary | ICD-10-CM

## 2013-03-16 NOTE — Telephone Encounter (Signed)
Patient called stating that she need a new rx for fenofribrate 160 mg 1poqd [facility] Please assist.

## 2013-03-17 MED ORDER — FENOFIBRATE 160 MG PO TABS: 160.0000 mg | ORAL_TABLET | Freq: Every day | ORAL | Status: DC

## 2013-03-17 NOTE — Telephone Encounter (Signed)
Pt has CPE on 04/20/13 and I did send e-scribe the below script.

## 2013-04-14 ENCOUNTER — Other Ambulatory Visit (INDEPENDENT_AMBULATORY_CARE_PROVIDER_SITE_OTHER): Payer: PRIVATE HEALTH INSURANCE

## 2013-04-14 DIAGNOSIS — Z0000 Encounter for general adult medical examination without abnormal findings: Secondary | ICD-10-CM

## 2013-04-14 LAB — POCT URINALYSIS DIPSTICK
Blood, UA: NEGATIVE
Glucose, UA: NEGATIVE
Ketones, UA: NEGATIVE
Protein, UA: NEGATIVE
Spec Grav, UA: 1.015
Urobilinogen, UA: 0.2

## 2013-04-14 LAB — CBC WITH DIFFERENTIAL/PLATELET
Basophils Absolute: 0 10*3/uL (ref 0.0–0.1)
Eosinophils Absolute: 0.1 10*3/uL (ref 0.0–0.7)
Lymphocytes Relative: 38.7 % (ref 12.0–46.0)
MCHC: 34.5 g/dL (ref 30.0–36.0)
Monocytes Absolute: 0.3 10*3/uL (ref 0.1–1.0)
Neutrophils Relative %: 53.2 % (ref 43.0–77.0)
Platelets: 277 10*3/uL (ref 150.0–400.0)
RBC: 4.2 Mil/uL (ref 3.87–5.11)
RDW: 12.6 % (ref 11.5–14.6)

## 2013-04-14 LAB — HEPATIC FUNCTION PANEL
ALT: 106 U/L — ABNORMAL HIGH (ref 0–35)
AST: 155 U/L — ABNORMAL HIGH (ref 0–37)
Alkaline Phosphatase: 40 U/L (ref 39–117)
Total Bilirubin: 0.8 mg/dL (ref 0.3–1.2)

## 2013-04-14 LAB — LIPID PANEL
Cholesterol: 160 mg/dL (ref 0–200)
LDL Cholesterol: 76 mg/dL (ref 0–99)
Triglycerides: 132 mg/dL (ref 0.0–149.0)
VLDL: 26.4 mg/dL (ref 0.0–40.0)

## 2013-04-14 LAB — BASIC METABOLIC PANEL
Chloride: 103 mEq/L (ref 96–112)
GFR: 97.28 mL/min (ref >60.00)
Potassium: 3.3 mEq/L — ABNORMAL LOW (ref 3.5–5.1)

## 2013-04-15 LAB — TSH: TSH: 1.25 u[IU]/mL (ref 0.35–5.50)

## 2013-04-20 ENCOUNTER — Encounter: Payer: Self-pay | Admitting: Family Medicine

## 2013-04-20 ENCOUNTER — Ambulatory Visit (INDEPENDENT_AMBULATORY_CARE_PROVIDER_SITE_OTHER): Payer: PRIVATE HEALTH INSURANCE | Admitting: Family Medicine

## 2013-04-20 VITALS — BP 124/76 | HR 81 | Temp 98.6°F | Ht 64.75 in | Wt 143.0 lb

## 2013-04-20 DIAGNOSIS — M810 Age-related osteoporosis without current pathological fracture: Secondary | ICD-10-CM

## 2013-04-20 DIAGNOSIS — Z0000 Encounter for general adult medical examination without abnormal findings: Secondary | ICD-10-CM

## 2013-04-20 MED ORDER — POTASSIUM CHLORIDE ER 10 MEQ PO TBCR: 10.0000 meq | EXTENDED_RELEASE_TABLET | Freq: Every day | ORAL | Status: DC

## 2013-04-20 MED ORDER — ALENDRONATE SODIUM 70 MG PO TABS: ORAL_TABLET | ORAL | Status: DC

## 2013-04-20 MED ORDER — LOSARTAN POTASSIUM 25 MG PO TABS: ORAL_TABLET | ORAL | Status: DC

## 2013-04-20 MED ORDER — HYDROCHLOROTHIAZIDE 25 MG PO TABS: ORAL_TABLET | ORAL | Status: DC

## 2013-04-20 NOTE — Progress Notes (Signed)
  Subjective:    Patient ID: Monique [name], female    DOB: 1946/03/31, 67 y.o.   MRN: [id number]  HPI 67 yr old female for a cpx. She feels well.    Review of Systems  Constitutional: Negative.   HENT: Negative.   Eyes: Negative.   Respiratory: Negative.   Cardiovascular: Negative.   Gastrointestinal: Negative.   Genitourinary: Negative for dysuria, urgency, frequency, hematuria, flank pain, decreased urine volume, enuresis, difficulty urinating, pelvic pain and dyspareunia.  Musculoskeletal: Negative.   Skin: Negative.   Neurological: Negative.   Psychiatric/Behavioral: Negative.        Objective:   Physical Exam  Constitutional: She is oriented to person, place, and time. She appears well-developed and well-nourished. No distress.  HENT:  Head: Normocephalic and atraumatic.  Right Ear: External ear normal.  Left Ear: External ear normal.  Nose: Nose normal.  Mouth/Throat: Oropharynx is clear and moist. No oropharyngeal exudate.  Eyes: Conjunctivae and EOM are normal. Pupils are equal, round, and reactive to light. No scleral icterus.  Neck: Normal range of motion. Neck supple. No JVD present. No thyromegaly present.  Cardiovascular: Normal rate, regular rhythm, normal heart sounds and intact distal pulses.  Exam reveals no gallop and no friction rub.   No murmur heard. EKG normal   Pulmonary/Chest: Effort normal and breath sounds normal. No respiratory distress. She has no wheezes. She has no rales. She exhibits no tenderness.  Abdominal: Soft. Bowel sounds are normal. She exhibits no distension and no mass. There is no tenderness. There is no rebound and no guarding.  Musculoskeletal: Normal range of motion. She exhibits no edema and no tenderness.  Lymphadenopathy:    She has no cervical adenopathy.  Neurological: She is alert and oriented to person, place, and time. She has normal reflexes. No cranial nerve deficit. She exhibits normal muscle tone. Coordination normal.   Skin: Skin is warm and dry. No rash noted. No erythema.  Psychiatric: She has a normal mood and affect. Her behavior is normal. Judgment and thought content normal.          Assessment & Plan:  Well exam. Her potassium is low so we will start her on supplementation. Her liver enzymes are elevated and this is probably from taking fenofibrate. She will stop this and we will recheck enzymes and lipids in 90 days. Set up a DEXA.

## 2013-04-20 NOTE — Progress Notes (Signed)
Quick Note:  Pt is here now for her CPE and will go over now. ______

## 2013-05-19 ENCOUNTER — Other Ambulatory Visit: Payer: PRIVATE HEALTH INSURANCE

## 2013-05-20 ENCOUNTER — Ambulatory Visit (INDEPENDENT_AMBULATORY_CARE_PROVIDER_SITE_OTHER)
Admission: RE | Admit: 2013-05-20 | Discharge: 2013-05-20 | Disposition: A | Discharge status: Home / Self Care | Payer: PRIVATE HEALTH INSURANCE | Source: Ambulatory Visit | Attending: Family Medicine | Admitting: Family Medicine

## 2013-05-20 DIAGNOSIS — M810 Age-related osteoporosis without current pathological fracture: Secondary | ICD-10-CM

## 2013-05-25 NOTE — Progress Notes (Signed)
Quick Note:  I left message and released results in my chart. ______

## 2013-06-04 ENCOUNTER — Other Ambulatory Visit: Payer: Self-pay | Admitting: Family Medicine

## 2013-06-10 DIAGNOSIS — D239 Other benign neoplasm of skin, unspecified: Secondary | ICD-10-CM | POA: Diagnosis not present

## 2013-06-10 DIAGNOSIS — L819 Disorder of pigmentation, unspecified: Secondary | ICD-10-CM | POA: Diagnosis not present

## 2013-06-10 DIAGNOSIS — D2360 Other benign neoplasm of skin of unspecified upper limb, including shoulder: Secondary | ICD-10-CM | POA: Diagnosis not present

## 2013-06-10 DIAGNOSIS — Z85828 Personal history of other malignant neoplasm of skin: Secondary | ICD-10-CM | POA: Diagnosis not present

## 2013-06-10 DIAGNOSIS — D485 Neoplasm of uncertain behavior of skin: Secondary | ICD-10-CM | POA: Diagnosis not present

## 2013-06-10 DIAGNOSIS — D1801 Hemangioma of skin and subcutaneous tissue: Secondary | ICD-10-CM | POA: Diagnosis not present

## 2013-06-10 DIAGNOSIS — L821 Other seborrheic keratosis: Secondary | ICD-10-CM | POA: Diagnosis not present

## 2013-06-11 DIAGNOSIS — D235 Other benign neoplasm of skin of trunk: Secondary | ICD-10-CM | POA: Diagnosis not present

## 2013-07-21 ENCOUNTER — Ambulatory Visit (INDEPENDENT_AMBULATORY_CARE_PROVIDER_SITE_OTHER): Payer: Medicare Other | Admitting: Family Medicine

## 2013-07-21 ENCOUNTER — Encounter: Payer: Self-pay | Admitting: Family Medicine

## 2013-07-21 VITALS — BP 130/80 | HR 67 | Temp 97.9°F | Wt 144.0 lb

## 2013-07-21 DIAGNOSIS — E785 Hyperlipidemia, unspecified: Secondary | ICD-10-CM

## 2013-07-21 DIAGNOSIS — R209 Unspecified disturbances of skin sensation: Secondary | ICD-10-CM | POA: Diagnosis not present

## 2013-07-21 DIAGNOSIS — R200 Anesthesia of skin: Secondary | ICD-10-CM

## 2013-07-21 DIAGNOSIS — Z23 Encounter for immunization: Secondary | ICD-10-CM

## 2013-07-21 LAB — HEPATIC FUNCTION PANEL
ALT: 55 U/L — ABNORMAL HIGH (ref 0–35)
Bilirubin, Direct: 0.2 mg/dL (ref 0.0–0.3)
Total Bilirubin: 1.3 mg/dL — ABNORMAL HIGH (ref 0.3–1.2)

## 2013-07-21 LAB — LIPID PANEL
HDL: 62.1 mg/dL (ref >39.00)
LDL Cholesterol: 59 mg/dL (ref 0–99)
Total CHOL/HDL Ratio: 3
Triglycerides: 192 mg/dL — ABNORMAL HIGH (ref 0.0–149.0)

## 2013-07-21 LAB — BASIC METABOLIC PANEL
Calcium: 9.1 mg/dL (ref 8.4–10.5)
Chloride: 99 mEq/L (ref 96–112)
Creatinine, Ser: 0.6 mg/dL (ref 0.4–1.2)
Potassium: 4.5 mEq/L (ref 3.5–5.1)

## 2013-07-21 NOTE — Progress Notes (Signed)
  Subjective:    Patient ID: Monique [name], female    DOB: 1946/09/25, 67 y.o.   MRN: [id number]  HPI Here to follow up some lab abnormalities and for left arm sx. Three months ago at her cpx she was started on potassium supplementation, and we stopped her fenofibrate due to elevated liver enzymes. She is fasting for labs today. She also mentions numbness and tingling that goes up and down the left arm for the past 3 months. Sometimes this goes into the hand. She has no pain in the neck or back. No weakness of the hand or arm. She has also noticed some intermittent twitching of the muscles around the left eye for several weeks.    Review of Systems  Constitutional: Negative.   Respiratory: Negative.   Cardiovascular: Negative.   Neurological: Positive for numbness. Negative for dizziness, tremors, seizures, syncope, facial asymmetry, speech difficulty, weakness, light-headedness and headaches.       Objective:   Physical Exam  Constitutional: She is oriented to person, place, and time. She appears well-developed and well-nourished.  Eyes: Conjunctivae and EOM are normal. Pupils are equal, round, and reactive to light.  Cardiovascular: Normal rate, regular rhythm, normal heart sounds and intact distal pulses.   Pulmonary/Chest: Effort normal and breath sounds normal.  Musculoskeletal: She exhibits no edema.  Neurological: She is alert and oriented to person, place, and time. She has normal reflexes. No cranial nerve deficit. She exhibits normal muscle tone. Coordination normal.  The left arm and hand appear intact. I can see some occasional twitches of the muscles in the left side of her face as we speak           Assessment & Plan:  We will get the labs above. Set up a NCS on the left arm to evaluate this further. I am not sure if the facial twitches are related to this or not.

## 2013-07-24 NOTE — Progress Notes (Signed)
Quick Note:  I left voice message for pt to return my call. Also released results in my chart. ______

## 2013-07-27 ENCOUNTER — Telehealth: Payer: Self-pay | Admitting: Family Medicine

## 2013-07-27 NOTE — Progress Notes (Signed)
Quick Note:  I left voice message for pt to return my call. ______ 

## 2013-07-27 NOTE — Telephone Encounter (Signed)
I left voice message for pt to return my call about lab results.

## 2013-07-28 NOTE — Telephone Encounter (Signed)
Pt states that she has reviewed her lab results through mychart, and understands what Dr. Clent Ridges said. She states that she will be home for 30 minutes if you would like to try calling again.

## 2013-07-28 NOTE — Telephone Encounter (Signed)
I spoke with pt and went over lab results from 07/21/13.

## 2013-08-06 ENCOUNTER — Encounter: Payer: Self-pay | Admitting: Neurology

## 2013-08-06 ENCOUNTER — Ambulatory Visit (INDEPENDENT_AMBULATORY_CARE_PROVIDER_SITE_OTHER): Payer: Medicare Other | Admitting: Neurology

## 2013-08-06 DIAGNOSIS — R209 Unspecified disturbances of skin sensation: Secondary | ICD-10-CM | POA: Diagnosis not present

## 2013-08-06 DIAGNOSIS — M5010 Cervical disc disorder with radiculopathy, unspecified cervical region: Secondary | ICD-10-CM

## 2013-08-06 DIAGNOSIS — M5412 Radiculopathy, cervical region: Secondary | ICD-10-CM

## 2013-08-06 NOTE — Procedures (Signed)
[facility]  [address] [place], [address]  [place], [place] [address] Tel: [phone] Fax:  [phone] Test Date:  08/06/2013  Patient: Monique [name] DOB: 24-Mar-1946 Physician: Nita Sickle, DO  Sex: Female Height: 5\' 5"  Ref Phys: Gershon Crane  ID#: [id number] Temp: 32.4C Technician:    Patient Complaints: 67 year-old female presenting 24-month history of numbness and tingling of her left arm.  NCV & EMG Findings: Extensive evaluation of the left upper extremity reveals normal motor and sensory responses. Needle electrode examination shows chronic motor axonal loss changes affecting the left C5 > and C6 myotomes, with active denervation isolated to the biceps muscle.   Impression: 1. 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   2. There is no evidence of any median neuropathy at or distal to the wrist.   ___________________________ Nita Sickle, DO    Nerve Conduction Studies Anti Sensory Summary Table   Site NR Peak (ms) Norm Peak (ms) P-T Amp (V) Norm P-T Amp  Left Median Anti Sensory (2nd Digit)  Wrist    3.4 <3.8 25.4 >10  Left Ulnar Anti Sensory (5th Digit)  Wrist    3.2 <3.2 25.5 >5   Motor Summary Table   Site NR Onset (ms) Norm Onset (ms) O-P Amp (mV) Norm O-P Amp Site1 Site2 Delta-0 (ms) Dist (cm) Vel (m/s) Norm Vel (m/s)  Left Median Motor (Abd Poll Brev)  Wrist    3.0 <4.0 9.6 >5 Elbow Wrist 5.2 32.0 62 >50  Elbow    8.2  8.7         Left Ulnar Motor (Abd Dig Minimi)  Wrist    2.7 <3.1 10.1 >7 B Elbow Wrist 4.3 22.0 51 >50  B Elbow    7.0  9.0  A Elbow B Elbow 2.2 10.0 45 >50  A Elbow    9.2  8.0          Comparison Summary Table   Site NR Peak (ms) Norm Peak (ms) P-T Amp (V) Site1 Site2 Delta-P (ms) Norm Delta (ms)  Left Median/Ulnar Palm Comparison (Wrist - 8cm)  Median Palm    1.9 <2.2 50.7 Median Palm  Ulnar Palm 0.0   Ulnar Palm    1.9 <2.2 14.2       EMG   Side Muscle Ins Act Fibs Psw Fasc Number Recrt Dur Dur. Amp Amp. Poly Poly. Comment  Left 1stDorInt Nml Nml Nml Nml Nml Nml Nml Nml Nml Nml Nml Nml N/A  Left Ext Indicis Nml Nml Nml Nml Nml Nml Nml Nml Nml Nml Nml Nml N/A  Left FlexPolLong Nml Nml Nml Nml Nml Nml Nml Nml Nml Nml Nml Nml N/A  Left PronatorTeres Nml Nml Nml Nml Nml Nml Nml Nml Nml Nml Nml Nml N/A  Left Biceps Nml Nml 1+ Nml 1- Mod Few 1+ Nml Nml Few 1+ N/A  Left Triceps Nml Nml Nml Nml Nml Nml Nml Nml Nml Nml Nml Nml N/A  Left BrachioRad Nml Nml Nml Nml 1- Mod-R Few 1+ Nml Nml Few 1+ N/A  Left Deltoid Nml Nml Nml Nml 2- Mod-R Some 1+ Nml Nml Some 1+ N/A  Left Infraspinatus Nml Nml Nml Nml 1- Mod Few 1+ Nml Nml Few 1+ N/A  Left Cervical Parasp Low Nml Nml Nml Nml NE - - - - - - - N/A      Waveforms:

## 2013-08-06 NOTE — Progress Notes (Signed)
See procedure note for EMG results.  Donika K. Patel, DO  

## 2013-08-18 ENCOUNTER — Encounter: Payer: Self-pay | Admitting: Family Medicine

## 2013-08-18 NOTE — Telephone Encounter (Signed)
I have not seen any results yet.

## 2013-08-27 ENCOUNTER — Encounter: Payer: Self-pay | Admitting: Family Medicine

## 2013-08-31 ENCOUNTER — Telehealth: Payer: Self-pay

## 2013-08-31 NOTE — Telephone Encounter (Signed)
Pt wants call back to discuss EMG results.

## 2013-09-01 ENCOUNTER — Encounter: Payer: Self-pay | Admitting: Family Medicine

## 2013-09-01 ENCOUNTER — Telehealth: Payer: Self-pay | Admitting: Neurology

## 2013-09-01 DIAGNOSIS — R200 Anesthesia of skin: Secondary | ICD-10-CM

## 2013-09-01 NOTE — Telephone Encounter (Signed)
Return call the patient and discussed that EMG results are available in Epic; however they are posted under a different tab (notes > procedures) which may be the reason why they were not seen.  She expressed understanding and will contact Dr. Claris Che office for the next step in her management. I mentioned that if he feel that neurological evaluation is necessary, we would be happy to see her here.  EMG 08/06/2013 1. 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  2. There is no evidence of any median neuropathy at or distal to the wrist.    Monique K. Allena Katz, DO

## 2013-09-02 NOTE — Telephone Encounter (Signed)
Tell her the test showed she has pinched nerves in the neck and not in the arm. We will set up an MRI scan of the cervical spine to evaluate further

## 2013-09-02 NOTE — Telephone Encounter (Signed)
I spoke with pt  

## 2013-09-02 NOTE — Addendum Note (Signed)
Addended by: Gershon Crane A on: 09/02/2013 01:01 PM   Modules accepted: Orders

## 2013-09-09 ENCOUNTER — Ambulatory Visit
Admission: RE | Admit: 2013-09-09 | Discharge: 2013-09-09 | Disposition: A | Discharge status: Home / Self Care | Payer: Medicare Other | Source: Ambulatory Visit | Attending: Family Medicine | Admitting: Family Medicine

## 2013-09-09 DIAGNOSIS — R200 Anesthesia of skin: Secondary | ICD-10-CM

## 2013-09-09 DIAGNOSIS — M4802 Spinal stenosis, cervical region: Secondary | ICD-10-CM | POA: Diagnosis not present

## 2013-09-11 NOTE — Addendum Note (Signed)
Addended by: Gershon Crane A on: 09/11/2013 05:06 PM   Modules accepted: Orders

## 2013-09-15 ENCOUNTER — Encounter: Payer: Self-pay | Admitting: Family Medicine

## 2013-09-28 DIAGNOSIS — M47812 Spondylosis without myelopathy or radiculopathy, cervical region: Secondary | ICD-10-CM | POA: Diagnosis not present

## 2013-09-30 ENCOUNTER — Other Ambulatory Visit: Payer: Self-pay | Admitting: Family Medicine

## 2013-09-30 DIAGNOSIS — Z1231 Encounter for screening mammogram for malignant neoplasm of breast: Secondary | ICD-10-CM

## 2013-10-13 ENCOUNTER — Ambulatory Visit (INDEPENDENT_AMBULATORY_CARE_PROVIDER_SITE_OTHER): Payer: Medicare Other | Admitting: Family Medicine

## 2013-10-13 DIAGNOSIS — Z23 Encounter for immunization: Secondary | ICD-10-CM

## 2013-10-15 ENCOUNTER — Ambulatory Visit (INDEPENDENT_AMBULATORY_CARE_PROVIDER_SITE_OTHER): Payer: Medicare Other

## 2013-10-15 DIAGNOSIS — Z1231 Encounter for screening mammogram for malignant neoplasm of breast: Secondary | ICD-10-CM | POA: Diagnosis not present

## 2013-12-01 DIAGNOSIS — D485 Neoplasm of uncertain behavior of skin: Secondary | ICD-10-CM | POA: Diagnosis not present

## 2013-12-02 DIAGNOSIS — L988 Other specified disorders of the skin and subcutaneous tissue: Secondary | ICD-10-CM | POA: Diagnosis not present

## 2013-12-18 ENCOUNTER — Encounter: Payer: Self-pay | Admitting: Family Medicine

## 2014-03-04 ENCOUNTER — Telehealth: Payer: Self-pay | Admitting: Family Medicine

## 2014-03-04 MED ORDER — VALACYCLOVIR HCL 500 MG PO TABS: ORAL_TABLET | ORAL | Status: DC

## 2014-03-04 NOTE — Telephone Encounter (Signed)
I sent pt a message through my chart. Dose pt want this sent locally or mail order?

## 2014-03-04 NOTE — Telephone Encounter (Signed)
Pt req rx on valACYclovir (VALTREX) 500 MG tablet  [facility]

## 2014-03-04 NOTE — Telephone Encounter (Signed)
Pt sent rx to [facility]

## 2014-03-25 DIAGNOSIS — L570 Actinic keratosis: Secondary | ICD-10-CM | POA: Diagnosis not present

## 2014-05-06 ENCOUNTER — Other Ambulatory Visit: Payer: Self-pay | Admitting: Family Medicine

## 2014-06-22 ENCOUNTER — Other Ambulatory Visit: Payer: Self-pay | Admitting: Family Medicine

## 2014-06-22 DIAGNOSIS — D485 Neoplasm of uncertain behavior of skin: Secondary | ICD-10-CM | POA: Diagnosis not present

## 2014-06-22 DIAGNOSIS — L570 Actinic keratosis: Secondary | ICD-10-CM | POA: Diagnosis not present

## 2014-06-22 DIAGNOSIS — D1801 Hemangioma of skin and subcutaneous tissue: Secondary | ICD-10-CM | POA: Diagnosis not present

## 2014-06-22 DIAGNOSIS — L819 Disorder of pigmentation, unspecified: Secondary | ICD-10-CM | POA: Diagnosis not present

## 2014-06-22 DIAGNOSIS — L821 Other seborrheic keratosis: Secondary | ICD-10-CM | POA: Diagnosis not present

## 2014-06-22 DIAGNOSIS — Z85828 Personal history of other malignant neoplasm of skin: Secondary | ICD-10-CM | POA: Diagnosis not present

## 2014-06-22 DIAGNOSIS — D239 Other benign neoplasm of skin, unspecified: Secondary | ICD-10-CM | POA: Diagnosis not present

## 2014-06-23 DIAGNOSIS — D2360 Other benign neoplasm of skin of unspecified upper limb, including shoulder: Secondary | ICD-10-CM | POA: Diagnosis not present

## 2014-07-01 ENCOUNTER — Telehealth: Payer: Self-pay | Admitting: Family Medicine

## 2014-07-01 MED ORDER — HYDROCHLOROTHIAZIDE 25 MG PO TABS: ORAL_TABLET | ORAL | Status: DC

## 2014-07-01 NOTE — Telephone Encounter (Signed)
Pt request refill of the following: hydrochlorothiazide (HYDRODIURIL) 25 MG tablet    Phamacy:  [facility]

## 2014-07-01 NOTE — Telephone Encounter (Signed)
Rx done. 

## 2014-07-15 ENCOUNTER — Encounter: Payer: Self-pay | Admitting: Gastroenterology

## 2014-07-20 ENCOUNTER — Other Ambulatory Visit (INDEPENDENT_AMBULATORY_CARE_PROVIDER_SITE_OTHER): Payer: Medicare Other

## 2014-07-20 DIAGNOSIS — E785 Hyperlipidemia, unspecified: Secondary | ICD-10-CM | POA: Diagnosis not present

## 2014-07-20 DIAGNOSIS — D649 Anemia, unspecified: Secondary | ICD-10-CM | POA: Diagnosis not present

## 2014-07-20 DIAGNOSIS — E039 Hypothyroidism, unspecified: Secondary | ICD-10-CM | POA: Diagnosis not present

## 2014-07-20 DIAGNOSIS — I10 Essential (primary) hypertension: Secondary | ICD-10-CM

## 2014-07-20 DIAGNOSIS — R300 Dysuria: Secondary | ICD-10-CM

## 2014-07-20 LAB — BASIC METABOLIC PANEL
BUN: 7 mg/dL (ref 6–23)
CHLORIDE: 98 meq/L (ref 96–112)
CO2: 29 meq/L (ref 19–32)
Calcium: 8.8 mg/dL (ref 8.4–10.5)
Creatinine, Ser: 0.5 mg/dL (ref 0.4–1.2)
GFR: 122.63 mL/min (ref >60.00)
GLUCOSE: 102 mg/dL — AB (ref 70–99)
POTASSIUM: 3.8 meq/L (ref 3.5–5.1)
SODIUM: 133 meq/L — AB (ref 135–145)

## 2014-07-20 LAB — LIPID PANEL
Cholesterol: 183 mg/dL (ref 0–200)
HDL: 64.3 mg/dL (ref >39.00)
LDL CALC: 101 mg/dL — AB (ref 0–99)
NONHDL: 118.7
Total CHOL/HDL Ratio: 3
Triglycerides: 90 mg/dL (ref 0.0–149.0)
VLDL: 18 mg/dL (ref 0.0–40.0)

## 2014-07-20 LAB — POCT URINALYSIS DIPSTICK
Bilirubin, UA: NEGATIVE
Blood, UA: NEGATIVE
Glucose, UA: NEGATIVE
Ketones, UA: NEGATIVE
Leukocytes, UA: NEGATIVE
Nitrite, UA: NEGATIVE
PROTEIN UA: NEGATIVE
Spec Grav, UA: 1.01
UROBILINOGEN UA: 0.2
pH, UA: 7.5

## 2014-07-20 LAB — CBC WITH DIFFERENTIAL/PLATELET
BASOS ABS: 0 10*3/uL (ref 0.0–0.1)
Basophils Relative: 0.3 % (ref 0.0–3.0)
Eosinophils Absolute: 0.1 10*3/uL (ref 0.0–0.7)
Eosinophils Relative: 1.1 % (ref 0.0–5.0)
HEMATOCRIT: 41 % (ref 36.0–46.0)
HEMOGLOBIN: 14.4 g/dL (ref 12.0–15.0)
LYMPHS ABS: 1.8 10*3/uL (ref 0.7–4.0)
Lymphocytes Relative: 29.7 % (ref 12.0–46.0)
MCHC: 35 g/dL (ref 30.0–36.0)
MCV: 99.8 fl (ref 78.0–100.0)
MONO ABS: 0.4 10*3/uL (ref 0.1–1.0)
Monocytes Relative: 6.5 % (ref 3.0–12.0)
Neutro Abs: 3.8 10*3/uL (ref 1.4–7.7)
Neutrophils Relative %: 62.4 % (ref 43.0–77.0)
PLATELETS: 228 10*3/uL (ref 150.0–400.0)
RBC: 4.11 Mil/uL (ref 3.87–5.11)
RDW: 12.1 % (ref 11.5–15.5)
WBC: 6.1 10*3/uL (ref 4.0–10.5)

## 2014-07-20 LAB — HEPATIC FUNCTION PANEL
ALBUMIN: 4.1 g/dL (ref 3.5–5.2)
ALT: 33 U/L (ref 0–35)
AST: 46 U/L — AB (ref 0–37)
Alkaline Phosphatase: 53 U/L (ref 39–117)
Bilirubin, Direct: 0.2 mg/dL (ref 0.0–0.3)
Total Bilirubin: 1.3 mg/dL — ABNORMAL HIGH (ref 0.2–1.2)
Total Protein: 7.2 g/dL (ref 6.0–8.3)

## 2014-07-20 LAB — TSH: TSH: 2.25 u[IU]/mL (ref 0.35–4.50)

## 2014-07-27 ENCOUNTER — Ambulatory Visit (INDEPENDENT_AMBULATORY_CARE_PROVIDER_SITE_OTHER): Payer: Medicare Other | Admitting: Family Medicine

## 2014-07-27 ENCOUNTER — Encounter: Payer: Self-pay | Admitting: Family Medicine

## 2014-07-27 VITALS — BP 160/80 | HR 67 | Temp 99.2°F | Ht 64.75 in | Wt 138.0 lb

## 2014-07-27 DIAGNOSIS — Z0000 Encounter for general adult medical examination without abnormal findings: Secondary | ICD-10-CM | POA: Diagnosis not present

## 2014-07-27 DIAGNOSIS — Z23 Encounter for immunization: Secondary | ICD-10-CM

## 2014-07-27 DIAGNOSIS — N318 Other neuromuscular dysfunction of bladder: Secondary | ICD-10-CM | POA: Diagnosis not present

## 2014-07-27 DIAGNOSIS — N3281 Overactive bladder: Secondary | ICD-10-CM | POA: Insufficient documentation

## 2014-07-27 MED ORDER — POTASSIUM CHLORIDE ER 10 MEQ PO TBCR: EXTENDED_RELEASE_TABLET | ORAL | Status: DC

## 2014-07-27 MED ORDER — HYDROCHLOROTHIAZIDE 25 MG PO TABS: ORAL_TABLET | ORAL | Status: DC

## 2014-07-27 MED ORDER — LOSARTAN POTASSIUM 50 MG PO TABS: 50.0000 mg | ORAL_TABLET | Freq: Every day | ORAL | Status: DC

## 2014-07-27 MED ORDER — TOLTERODINE TARTRATE ER 2 MG PO CP24: 2.0000 mg | ORAL_CAPSULE | Freq: Every day | ORAL | Status: DC

## 2014-07-27 NOTE — Progress Notes (Signed)
Pre visit review using our clinic review tool, if applicable. No additional management support is needed unless otherwise documented below in the visit note. 

## 2014-07-27 NOTE — Progress Notes (Signed)
   Subjective:    Patient ID: Monique [name], female    DOB: 01-29-1946, 68 y.o.   MRN: [id number]  HPI 68 yr old female for a cpx. She is doing well but has a few concerns. She has noticed that her systolic BP at home has crept up to the 150s and 160s lately, even though she still exercises almost every day. She has lost 6 lbs since her last visit here. Also she has had some increased urgency to urinate both during the day and the night. No accidents. No discomfort. Her recent UA was normal.    Review of Systems  Constitutional: Negative.   HENT: Negative.   Eyes: Negative.   Respiratory: Negative.   Cardiovascular: Negative.   Gastrointestinal: Negative.   Genitourinary: Positive for urgency and frequency. Negative for dysuria, hematuria, flank pain, decreased urine volume, enuresis, difficulty urinating, pelvic pain and dyspareunia.  Musculoskeletal: Negative.   Skin: Negative.   Neurological: Negative.   Psychiatric/Behavioral: Negative.        Objective:   Physical Exam  Constitutional: She is oriented to person, place, and time. She appears well-developed and well-nourished. No distress.  HENT:  Head: Normocephalic and atraumatic.  Right Ear: External ear normal.  Left Ear: External ear normal.  Nose: Nose normal.  Mouth/Throat: Oropharynx is clear and moist. No oropharyngeal exudate.  Eyes: Conjunctivae and EOM are normal. Pupils are equal, round, and reactive to light. No scleral icterus.  Neck: Normal range of motion. Neck supple. No JVD present. No thyromegaly present.  Cardiovascular: Normal rate, regular rhythm, normal heart sounds and intact distal pulses.  Exam reveals no gallop and no friction rub.   No murmur heard. Pulmonary/Chest: Effort normal and breath sounds normal. No respiratory distress. She has no wheezes. She has no rales. She exhibits no tenderness.  Abdominal: Soft. Bowel sounds are normal. She exhibits no distension and no mass. There is no tenderness.  There is no rebound and no guarding.  Musculoskeletal: Normal range of motion. She exhibits no edema and no tenderness.  Lymphadenopathy:    She has no cervical adenopathy.  Neurological: She is alert and oriented to person, place, and time. She has normal reflexes. No cranial nerve deficit. She exhibits normal muscle tone. Coordination normal.  Skin: Skin is warm and dry. No rash noted. No erythema.  Psychiatric: She has a normal mood and affect. Her behavior is normal. Judgment and thought content normal.          Assessment & Plan:  Well exam. We will increase the Losartan to 50 mg daily. Try Detrol LA 2 mg daily for the OAB. She has been on Fosamax for the past 6 years, so we will stop this. Her next DEXA is not due until next year.

## 2014-07-27 NOTE — Addendum Note (Signed)
Addended by: Aggie Hacker A on: 07/27/2014 11:22 AM   Modules accepted: Orders

## 2014-10-18 ENCOUNTER — Other Ambulatory Visit: Payer: Self-pay | Admitting: Family Medicine

## 2014-10-18 DIAGNOSIS — Z1231 Encounter for screening mammogram for malignant neoplasm of breast: Secondary | ICD-10-CM

## 2014-10-19 MED ORDER — VALACYCLOVIR HCL 500 MG PO TABS: ORAL_TABLET | ORAL | Status: DC

## 2014-10-19 NOTE — Telephone Encounter (Signed)
Sunday Spillers can you please close this. Thanks!!!

## 2014-10-19 NOTE — Telephone Encounter (Signed)
I sent script e-scribe. 

## 2014-11-04 ENCOUNTER — Ambulatory Visit (INDEPENDENT_AMBULATORY_CARE_PROVIDER_SITE_OTHER): Payer: Medicare Other

## 2014-11-04 DIAGNOSIS — Z1231 Encounter for screening mammogram for malignant neoplasm of breast: Secondary | ICD-10-CM

## 2014-11-22 ENCOUNTER — Other Ambulatory Visit: Payer: Self-pay | Admitting: Family Medicine

## 2015-06-28 DIAGNOSIS — L814 Other melanin hyperpigmentation: Secondary | ICD-10-CM | POA: Diagnosis not present

## 2015-06-28 DIAGNOSIS — L821 Other seborrheic keratosis: Secondary | ICD-10-CM | POA: Diagnosis not present

## 2015-06-28 DIAGNOSIS — L720 Epidermal cyst: Secondary | ICD-10-CM | POA: Diagnosis not present

## 2015-06-28 DIAGNOSIS — D1800 Hemangioma unspecified site: Secondary | ICD-10-CM | POA: Diagnosis not present

## 2015-06-28 DIAGNOSIS — D225 Melanocytic nevi of trunk: Secondary | ICD-10-CM | POA: Diagnosis not present

## 2015-06-28 DIAGNOSIS — L570 Actinic keratosis: Secondary | ICD-10-CM | POA: Diagnosis not present

## 2015-06-28 DIAGNOSIS — Z85828 Personal history of other malignant neoplasm of skin: Secondary | ICD-10-CM | POA: Diagnosis not present

## 2015-08-01 ENCOUNTER — Encounter: Payer: Self-pay | Admitting: Family Medicine

## 2015-08-01 ENCOUNTER — Ambulatory Visit (INDEPENDENT_AMBULATORY_CARE_PROVIDER_SITE_OTHER): Payer: Medicare Other | Admitting: Family Medicine

## 2015-08-01 VITALS — BP 152/85 | HR 66 | Temp 98.1°F | Ht 64.75 in | Wt 132.0 lb

## 2015-08-01 DIAGNOSIS — M899 Disorder of bone, unspecified: Secondary | ICD-10-CM | POA: Diagnosis not present

## 2015-08-01 DIAGNOSIS — N3281 Overactive bladder: Secondary | ICD-10-CM

## 2015-08-01 DIAGNOSIS — I10 Essential (primary) hypertension: Secondary | ICD-10-CM | POA: Diagnosis not present

## 2015-08-01 DIAGNOSIS — E785 Hyperlipidemia, unspecified: Secondary | ICD-10-CM | POA: Diagnosis not present

## 2015-08-01 DIAGNOSIS — Z23 Encounter for immunization: Secondary | ICD-10-CM

## 2015-08-01 DIAGNOSIS — M949 Disorder of cartilage, unspecified: Secondary | ICD-10-CM

## 2015-08-01 LAB — LIPID PANEL
CHOL/HDL RATIO: 3
Cholesterol: 191 mg/dL (ref 0–200)
HDL: 65.8 mg/dL (ref >39.00)
NONHDL: 125.21
Triglycerides: 219 mg/dL — ABNORMAL HIGH (ref 0.0–149.0)
VLDL: 43.8 mg/dL — ABNORMAL HIGH (ref 0.0–40.0)

## 2015-08-01 LAB — CBC WITH DIFFERENTIAL/PLATELET
BASOS ABS: 0 10*3/uL (ref 0.0–0.1)
Basophils Relative: 0.3 % (ref 0.0–3.0)
EOS ABS: 0.1 10*3/uL (ref 0.0–0.7)
Eosinophils Relative: 0.9 % (ref 0.0–5.0)
HEMATOCRIT: 43.2 % (ref 36.0–46.0)
Hemoglobin: 14.8 g/dL (ref 12.0–15.0)
LYMPHS PCT: 38.9 % (ref 12.0–46.0)
Lymphs Abs: 2.9 10*3/uL (ref 0.7–4.0)
MCHC: 34.2 g/dL (ref 30.0–36.0)
MCV: 101.2 fl — ABNORMAL HIGH (ref 78.0–100.0)
Monocytes Absolute: 0.4 10*3/uL (ref 0.1–1.0)
Monocytes Relative: 5.1 % (ref 3.0–12.0)
NEUTROS PCT: 54.8 % (ref 43.0–77.0)
Neutro Abs: 4.1 10*3/uL (ref 1.4–7.7)
Platelets: 315 10*3/uL (ref 150.0–400.0)
RBC: 4.27 Mil/uL (ref 3.87–5.11)
RDW: 12.5 % (ref 11.5–15.5)
WBC: 7.4 10*3/uL (ref 4.0–10.5)

## 2015-08-01 LAB — POCT URINALYSIS DIPSTICK
BILIRUBIN UA: NEGATIVE
Blood, UA: NEGATIVE
Glucose, UA: NEGATIVE
KETONES UA: NEGATIVE
Leukocytes, UA: NEGATIVE
Nitrite, UA: NEGATIVE
PH UA: 7.5
PROTEIN UA: NEGATIVE
SPEC GRAV UA: 1.015
Urobilinogen, UA: 0.2

## 2015-08-01 LAB — BASIC METABOLIC PANEL
BUN: 12 mg/dL (ref 6–23)
CO2: 29 mEq/L (ref 19–32)
Calcium: 9.4 mg/dL (ref 8.4–10.5)
Chloride: 98 mEq/L (ref 96–112)
Creatinine, Ser: 0.68 mg/dL (ref 0.40–1.20)
GFR: 91.7 mL/min (ref >60.00)
Glucose, Bld: 86 mg/dL (ref 70–99)
POTASSIUM: 4.5 meq/L (ref 3.5–5.1)
SODIUM: 139 meq/L (ref 135–145)

## 2015-08-01 LAB — TSH: TSH: 1.79 u[IU]/mL (ref 0.35–4.50)

## 2015-08-01 LAB — HEPATIC FUNCTION PANEL
ALBUMIN: 4.5 g/dL (ref 3.5–5.2)
ALT: 31 U/L (ref 0–35)
AST: 41 U/L — ABNORMAL HIGH (ref 0–37)
Alkaline Phosphatase: 54 U/L (ref 39–117)
BILIRUBIN TOTAL: 0.8 mg/dL (ref 0.2–1.2)
Bilirubin, Direct: 0.2 mg/dL (ref 0.0–0.3)
Total Protein: 7.3 g/dL (ref 6.0–8.3)

## 2015-08-01 MED ORDER — POTASSIUM CHLORIDE ER 10 MEQ PO TBCR: EXTENDED_RELEASE_TABLET | ORAL | Status: DC

## 2015-08-01 MED ORDER — LOSARTAN POTASSIUM 50 MG PO TABS: 50.0000 mg | ORAL_TABLET | Freq: Every day | ORAL | Status: DC

## 2015-08-01 MED ORDER — HYDROCHLOROTHIAZIDE 25 MG PO TABS: ORAL_TABLET | ORAL | Status: DC

## 2015-08-01 NOTE — Progress Notes (Signed)
   Subjective:    Patient ID: Monique [name], female    DOB: 03-04-1946, 69 y.o.   MRN: [id number]  HPI 69 yr old female to follow up issues like HTN and elevated lipids. She feels great and exercises daily. Her BP is stable at her pharmacy in the range of 130s over 70s.    Review of Systems  Constitutional: Negative.   HENT: Negative.   Eyes: Negative.   Respiratory: Negative.   Cardiovascular: Negative.   Gastrointestinal: Negative.   Genitourinary: Negative for dysuria, urgency, frequency, hematuria, flank pain, decreased urine volume, enuresis, difficulty urinating, pelvic pain and dyspareunia.  Musculoskeletal: Negative.   Skin: Negative.   Neurological: Negative.   Psychiatric/Behavioral: Negative.        Objective:   Physical Exam  Constitutional: She is oriented to person, place, and time. She appears well-developed and well-nourished. No distress.  HENT:  Head: Normocephalic and atraumatic.  Right Ear: External ear normal.  Left Ear: External ear normal.  Nose: Nose normal.  Mouth/Throat: Oropharynx is clear and moist. No oropharyngeal exudate.  Eyes: Conjunctivae and EOM are normal. Pupils are equal, round, and reactive to light. No scleral icterus.  Neck: Normal range of motion. Neck supple. No JVD present. No thyromegaly present.  Cardiovascular: Normal rate, regular rhythm, normal heart sounds and intact distal pulses.  Exam reveals no gallop and no friction rub.   No murmur heard. EKG normal   Pulmonary/Chest: Effort normal and breath sounds normal. No respiratory distress. She has no wheezes. She has no rales. She exhibits no tenderness.  Abdominal: Soft. Bowel sounds are normal. She exhibits no distension and no mass. There is no tenderness. There is no rebound and no guarding.  Musculoskeletal: Normal range of motion. She exhibits no edema or tenderness.  Lymphadenopathy:    She has no cervical adenopathy.  Neurological: She is alert and oriented to person,  place, and time. She has normal reflexes. No cranial nerve deficit. She exhibits normal muscle tone. Coordination normal.  Skin: Skin is warm and dry. No rash noted. No erythema.  Psychiatric: She has a normal mood and affect. Her behavior is normal. Judgment and thought content normal.          Assessment & Plan:  Her HTN is stable. We will get fasting labs to check her lipids, among other things. She stopped the Detrol and she is urinating fine right now. We discussed diet and exercise advice.

## 2015-08-01 NOTE — Progress Notes (Signed)
Pre visit review using our clinic review tool, if applicable. No additional management support is needed unless otherwise documented below in the visit note. 

## 2015-08-02 LAB — LDL CHOLESTEROL, DIRECT: Direct LDL: 92 mg/dL

## 2015-10-10 ENCOUNTER — Other Ambulatory Visit: Payer: Self-pay | Admitting: Family Medicine

## 2015-10-10 DIAGNOSIS — Z1231 Encounter for screening mammogram for malignant neoplasm of breast: Secondary | ICD-10-CM

## 2015-11-09 ENCOUNTER — Ambulatory Visit (INDEPENDENT_AMBULATORY_CARE_PROVIDER_SITE_OTHER): Payer: Medicare Other

## 2015-11-09 DIAGNOSIS — Z1231 Encounter for screening mammogram for malignant neoplasm of breast: Secondary | ICD-10-CM | POA: Diagnosis not present

## 2015-11-24 ENCOUNTER — Encounter: Payer: Self-pay | Admitting: Family Medicine

## 2015-11-28 ENCOUNTER — Ambulatory Visit (INDEPENDENT_AMBULATORY_CARE_PROVIDER_SITE_OTHER): Payer: Medicare Other | Admitting: Family Medicine

## 2015-11-28 ENCOUNTER — Encounter: Payer: Self-pay | Admitting: Family Medicine

## 2015-11-28 VITALS — BP 177/98 | HR 67 | Temp 98.2°F | Ht 64.75 in | Wt 136.0 lb

## 2015-11-28 DIAGNOSIS — I10 Essential (primary) hypertension: Secondary | ICD-10-CM

## 2015-11-28 DIAGNOSIS — M542 Cervicalgia: Secondary | ICD-10-CM | POA: Diagnosis not present

## 2015-11-28 DIAGNOSIS — L570 Actinic keratosis: Secondary | ICD-10-CM | POA: Diagnosis not present

## 2015-11-28 MED ORDER — LOSARTAN POTASSIUM-HCTZ 100-25 MG PO TABS: 1.0000 | ORAL_TABLET | Freq: Every day | ORAL | Status: DC

## 2015-11-28 MED ORDER — AMLODIPINE BESYLATE 5 MG PO TABS: 5.0000 mg | ORAL_TABLET | Freq: Every day | ORAL | Status: DC

## 2015-11-28 NOTE — Progress Notes (Signed)
Pre visit review using our clinic review tool, if applicable. No additional management support is needed unless otherwise documented below in the visit note. 

## 2015-11-29 ENCOUNTER — Encounter: Payer: Self-pay | Admitting: Family Medicine

## 2015-11-29 NOTE — Progress Notes (Signed)
   Subjective:    Patient ID: Monique [name], female    DOB: [birth year], 70 y.o.   MRN: [id number]  HPI Here to follow up HTN and to discuss a stiff neck she has had for the past week. No recent trauma. Using heat and Advil. She has not checked her BP in some time, she denies any headaches or chest pain or SOB.    Review of Systems  Constitutional: Negative.   Respiratory: Negative.   Cardiovascular: Negative.   Musculoskeletal: Positive for neck pain and neck stiffness.  Neurological: Negative.        Objective:   Physical Exam  Constitutional: She is oriented to person, place, and time. She appears well-developed and well-nourished. No distress.  Neck: No thyromegaly present.  Cardiovascular: Normal rate, regular rhythm, normal heart sounds and intact distal pulses.   Pulmonary/Chest: Effort normal and breath sounds normal.  Musculoskeletal:  Mild tenderness in the posterior neck with full ROM  Lymphadenopathy:    She has no cervical adenopathy.  Neurological: She is alert and oriented to person, place, and time.          Assessment & Plan:  For there neck pain she can use Diclofenac and she may get a deep tissue massage. For the HTN we will combine the Losartan and HCTZ into Losartan HCT 100/25 daily and add Amlodipine 5 mg daily. Recheck in 3-4 weeks

## 2015-12-21 ENCOUNTER — Encounter: Payer: Self-pay | Admitting: Family Medicine

## 2015-12-23 MED ORDER — AMLODIPINE BESYLATE 5 MG PO TABS: 5.0000 mg | ORAL_TABLET | Freq: Every day | ORAL | Status: DC

## 2015-12-23 MED ORDER — LOSARTAN POTASSIUM-HCTZ 100-25 MG PO TABS: 1.0000 | ORAL_TABLET | Freq: Every day | ORAL | Status: DC

## 2015-12-27 ENCOUNTER — Ambulatory Visit: Payer: Medicare Other | Admitting: Family Medicine

## 2015-12-28 ENCOUNTER — Ambulatory Visit (INDEPENDENT_AMBULATORY_CARE_PROVIDER_SITE_OTHER): Payer: Medicare Other | Admitting: Family Medicine

## 2015-12-28 ENCOUNTER — Encounter: Payer: Self-pay | Admitting: Family Medicine

## 2015-12-28 VITALS — BP 138/78 | HR 64 | Temp 98.6°F | Ht 64.75 in | Wt 132.0 lb

## 2015-12-28 DIAGNOSIS — I10 Essential (primary) hypertension: Secondary | ICD-10-CM

## 2015-12-28 DIAGNOSIS — M542 Cervicalgia: Secondary | ICD-10-CM | POA: Diagnosis not present

## 2015-12-28 NOTE — Progress Notes (Signed)
   Subjective:    Patient ID: Monique [name], female    DOB: Jan 30, 1946, 70 y.o.   MRN: [id number]  HPI Here to follow up on neck pain and HTN. At our last visit we added Amlodipine to her regimen, and she has felt fine. Her BP has normalized. Her neck pain feels better after she had a massage.    Review of Systems  Constitutional: Negative.   Respiratory: Negative.   Cardiovascular: Negative.   Musculoskeletal: Positive for neck pain and neck stiffness.  Neurological: Negative.        Objective:   Physical Exam  Constitutional: She is oriented to person, place, and time. She appears well-developed and well-nourished.  Cardiovascular: Normal rate, regular rhythm, normal heart sounds and intact distal pulses.   Pulmonary/Chest: Effort normal and breath sounds normal.  Musculoskeletal: Normal range of motion. She exhibits no edema or tenderness.  Neurological: She is alert and oriented to person, place, and time.          Assessment & Plan:  Her neck pain is resolvoing. Her HTN is well controlled.

## 2015-12-28 NOTE — Progress Notes (Signed)
Pre visit review using our clinic review tool, if applicable. No additional management support is needed unless otherwise documented below in the visit note. 

## 2016-02-01 DIAGNOSIS — H25813 Combined forms of age-related cataract, bilateral: Secondary | ICD-10-CM | POA: Diagnosis not present

## 2016-02-01 DIAGNOSIS — H5203 Hypermetropia, bilateral: Secondary | ICD-10-CM | POA: Diagnosis not present

## 2016-02-01 DIAGNOSIS — H52223 Regular astigmatism, bilateral: Secondary | ICD-10-CM | POA: Diagnosis not present

## 2016-02-01 DIAGNOSIS — D3142 Benign neoplasm of left ciliary body: Secondary | ICD-10-CM | POA: Diagnosis not present

## 2016-02-01 DIAGNOSIS — H3540 Unspecified peripheral retinal degeneration: Secondary | ICD-10-CM | POA: Diagnosis not present

## 2016-03-28 ENCOUNTER — Other Ambulatory Visit: Payer: Self-pay | Admitting: Family Medicine

## 2016-06-27 DIAGNOSIS — L237 Allergic contact dermatitis due to plants, except food: Secondary | ICD-10-CM | POA: Diagnosis not present

## 2016-06-27 DIAGNOSIS — D225 Melanocytic nevi of trunk: Secondary | ICD-10-CM | POA: Diagnosis not present

## 2016-06-27 DIAGNOSIS — L570 Actinic keratosis: Secondary | ICD-10-CM | POA: Diagnosis not present

## 2016-06-27 DIAGNOSIS — Z85828 Personal history of other malignant neoplasm of skin: Secondary | ICD-10-CM | POA: Diagnosis not present

## 2016-06-27 DIAGNOSIS — L814 Other melanin hyperpigmentation: Secondary | ICD-10-CM | POA: Diagnosis not present

## 2016-06-27 DIAGNOSIS — B070 Plantar wart: Secondary | ICD-10-CM | POA: Diagnosis not present

## 2016-06-27 DIAGNOSIS — D1800 Hemangioma unspecified site: Secondary | ICD-10-CM | POA: Diagnosis not present

## 2016-06-27 DIAGNOSIS — L821 Other seborrheic keratosis: Secondary | ICD-10-CM | POA: Diagnosis not present

## 2016-08-02 ENCOUNTER — Encounter: Payer: Self-pay | Admitting: Family Medicine

## 2016-08-02 ENCOUNTER — Ambulatory Visit (INDEPENDENT_AMBULATORY_CARE_PROVIDER_SITE_OTHER): Payer: Medicare Other | Admitting: Family Medicine

## 2016-08-02 VITALS — BP 164/93 | HR 62 | Ht 64.75 in | Wt 129.0 lb

## 2016-08-02 DIAGNOSIS — Z23 Encounter for immunization: Secondary | ICD-10-CM

## 2016-08-02 DIAGNOSIS — I10 Essential (primary) hypertension: Secondary | ICD-10-CM | POA: Diagnosis not present

## 2016-08-02 DIAGNOSIS — E785 Hyperlipidemia, unspecified: Secondary | ICD-10-CM

## 2016-08-02 DIAGNOSIS — Z209 Contact with and (suspected) exposure to unspecified communicable disease: Secondary | ICD-10-CM

## 2016-08-02 LAB — CBC WITH DIFFERENTIAL/PLATELET
BASOS ABS: 0 10*3/uL (ref 0.0–0.1)
BASOS PCT: 0.3 % (ref 0.0–3.0)
EOS ABS: 0.1 10*3/uL (ref 0.0–0.7)
Eosinophils Relative: 1.1 % (ref 0.0–5.0)
HEMATOCRIT: 41.1 % (ref 36.0–46.0)
Hemoglobin: 14 g/dL (ref 12.0–15.0)
LYMPHS ABS: 2.1 10*3/uL (ref 0.7–4.0)
LYMPHS PCT: 32.7 % (ref 12.0–46.0)
MCHC: 34.1 g/dL (ref 30.0–36.0)
MCV: 99.9 fl (ref 78.0–100.0)
MONO ABS: 0.3 10*3/uL (ref 0.1–1.0)
Monocytes Relative: 5.2 % (ref 3.0–12.0)
NEUTROS ABS: 3.8 10*3/uL (ref 1.4–7.7)
NEUTROS PCT: 60.7 % (ref 43.0–77.0)
PLATELETS: 249 10*3/uL (ref 150.0–400.0)
RBC: 4.11 Mil/uL (ref 3.87–5.11)
RDW: 13 % (ref 11.5–15.5)
WBC: 6.3 10*3/uL (ref 4.0–10.5)

## 2016-08-02 LAB — BASIC METABOLIC PANEL
BUN: 12 mg/dL (ref 6–23)
CHLORIDE: 103 meq/L (ref 96–112)
CO2: 30 meq/L (ref 19–32)
CREATININE: 0.55 mg/dL (ref 0.40–1.20)
Calcium: 9 mg/dL (ref 8.4–10.5)
GFR: 116.78 mL/min (ref >60.00)
GLUCOSE: 92 mg/dL (ref 70–99)
POTASSIUM: 4.7 meq/L (ref 3.5–5.1)
Sodium: 141 mEq/L (ref 135–145)

## 2016-08-02 LAB — POC URINALSYSI DIPSTICK (AUTOMATED)
BILIRUBIN UA: NEGATIVE
Blood, UA: NEGATIVE
Glucose, UA: NEGATIVE
KETONES UA: NEGATIVE
LEUKOCYTES UA: NEGATIVE
Nitrite, UA: NEGATIVE
PH UA: 6
Protein, UA: NEGATIVE
SPEC GRAV UA: 1.01
Urobilinogen, UA: 0.2

## 2016-08-02 LAB — LIPID PANEL
CHOLESTEROL: 185 mg/dL (ref 0–200)
HDL: 68.7 mg/dL (ref >39.00)
LDL Cholesterol: 100 mg/dL — ABNORMAL HIGH (ref 0–99)
NonHDL: 116.24
TRIGLYCERIDES: 81 mg/dL (ref 0.0–149.0)
Total CHOL/HDL Ratio: 3
VLDL: 16.2 mg/dL (ref 0.0–40.0)

## 2016-08-02 LAB — HEPATIC FUNCTION PANEL
ALBUMIN: 4.2 g/dL (ref 3.5–5.2)
ALT: 22 U/L (ref 0–35)
AST: 24 U/L (ref 0–37)
Alkaline Phosphatase: 53 U/L (ref 39–117)
BILIRUBIN TOTAL: 1.4 mg/dL — AB (ref 0.2–1.2)
Bilirubin, Direct: 0.3 mg/dL (ref 0.0–0.3)
TOTAL PROTEIN: 6.7 g/dL (ref 6.0–8.3)

## 2016-08-02 LAB — TSH: TSH: 1.2 u[IU]/mL (ref 0.35–4.50)

## 2016-08-02 MED ORDER — POTASSIUM CHLORIDE ER 10 MEQ PO TBCR: EXTENDED_RELEASE_TABLET | ORAL | 3 refills | Status: DC

## 2016-08-02 MED ORDER — POTASSIUM CHLORIDE ER 10 MEQ PO TBCR: EXTENDED_RELEASE_TABLET | ORAL | 0 refills | Status: DC

## 2016-08-02 NOTE — Progress Notes (Signed)
   Subjective:    Patient ID: Monique [name], female    DOB: Jul 26, 1946, 70 y.o.   MRN: [id number]  HPI 70 yr old female to follow up on issues like lipids and HTN/. She feels fine. Her BP at home is stable in the [id number] or [id number] systolic. She walks for exercise.    Review of Systems  Constitutional: Negative.   HENT: Negative.   Eyes: Negative.   Respiratory: Negative.   Cardiovascular: Negative.   Gastrointestinal: Negative.   Genitourinary: Negative for decreased urine volume, difficulty urinating, dyspareunia, dysuria, enuresis, flank pain, frequency, hematuria, pelvic pain and urgency.  Musculoskeletal: Negative.   Skin: Negative.   Neurological: Negative.   Psychiatric/Behavioral: Negative.        Objective:   Physical Exam  Constitutional: She is oriented to person, place, and time. She appears well-developed and well-nourished. No distress.  HENT:  Head: Normocephalic and atraumatic.  Right Ear: External ear normal.  Left Ear: External ear normal.  Nose: Nose normal.  Mouth/Throat: Oropharynx is clear and moist. No oropharyngeal exudate.  Eyes: Conjunctivae and EOM are normal. Pupils are equal, round, and reactive to light. No scleral icterus.  Neck: Normal range of motion. Neck supple. No JVD present. No thyromegaly present.  Cardiovascular: Normal rate, regular rhythm, normal heart sounds and intact distal pulses.  Exam reveals no gallop and no friction rub.   No murmur heard. EKG normal   Pulmonary/Chest: Effort normal and breath sounds normal. No respiratory distress. She has no wheezes. She has no rales. She exhibits no tenderness.  Abdominal: Soft. Bowel sounds are normal. She exhibits no distension and no mass. There is no tenderness. There is no rebound and no guarding.  Musculoskeletal: Normal range of motion. She exhibits no edema or tenderness.  Lymphadenopathy:    She has no cervical adenopathy.  Neurological: She is alert and oriented to person, place, and  time. She has normal reflexes. No cranial nerve deficit. She exhibits normal muscle tone. Coordination normal.  Skin: Skin is warm and dry. No rash noted. No erythema.  Psychiatric: She has a normal mood and affect. Her behavior is normal. Judgment and thought content normal.          Assessment & Plan:  Her HTN is stable. Her OAB is stable. We will get fasting labs today to check her lipids among other things. We discussed diet and exercise advice.  Laurey Morale, MD

## 2016-08-02 NOTE — Progress Notes (Signed)
Pre visit review using our clinic review tool, if applicable. No additional management support is needed unless otherwise documented below in the visit note. 

## 2016-08-03 LAB — HEPATITIS C ANTIBODY: HCV AB: NEGATIVE

## 2016-08-26 ENCOUNTER — Other Ambulatory Visit: Payer: Self-pay | Admitting: Family Medicine

## 2016-08-29 ENCOUNTER — Telehealth: Payer: Self-pay | Admitting: Family Medicine

## 2016-08-29 NOTE — Telephone Encounter (Signed)
Can you confirm and I will send in a new script?

## 2016-08-29 NOTE — Telephone Encounter (Signed)
Pharmacy states they did not know pt's losartan (COZAAR) 50 MG tablet   had been dc'd an new rx losartan-hydrochlorothiazide (HYZAAR) 100-25 MG tablet   had replaced back in March. They would like a call back to confirm this information so they can send out the correct med.  Ref no:  [id number]  Phone: [phone]

## 2016-08-30 NOTE — Telephone Encounter (Signed)
Please confirm to them she is on Hyzaar and not Cozaar, and refill Hyzaar for one year

## 2016-09-03 MED ORDER — LOSARTAN POTASSIUM-HCTZ 100-25 MG PO TABS: 1.0000 | ORAL_TABLET | Freq: Every day | ORAL | 3 refills | Status: DC

## 2016-09-03 NOTE — Telephone Encounter (Signed)
I sent new script e-scribe for Hyzaar to [facility] mail order.

## 2016-09-04 ENCOUNTER — Encounter: Payer: Self-pay | Admitting: Family Medicine

## 2016-10-19 ENCOUNTER — Other Ambulatory Visit: Payer: Self-pay | Admitting: Family Medicine

## 2016-10-19 DIAGNOSIS — Z1231 Encounter for screening mammogram for malignant neoplasm of breast: Secondary | ICD-10-CM

## 2016-11-13 ENCOUNTER — Encounter: Payer: Self-pay | Admitting: Family Medicine

## 2016-11-14 ENCOUNTER — Ambulatory Visit: Payer: Medicare Other

## 2016-11-16 ENCOUNTER — Other Ambulatory Visit: Payer: Self-pay | Admitting: Family Medicine

## 2016-11-16 NOTE — Telephone Encounter (Signed)
I think we should increase the dose of the Amlodipine to 10 mg daily. Call in #[phone] with 2 rf. Report back in 2 weeks

## 2016-11-16 NOTE — Telephone Encounter (Signed)
I spoke with pt and went over advice, also updated dosage increase in medication list.

## 2016-11-21 ENCOUNTER — Ambulatory Visit (INDEPENDENT_AMBULATORY_CARE_PROVIDER_SITE_OTHER): Payer: Medicare Other

## 2016-11-21 DIAGNOSIS — Z1231 Encounter for screening mammogram for malignant neoplasm of breast: Secondary | ICD-10-CM | POA: Diagnosis not present

## 2016-11-29 ENCOUNTER — Encounter: Payer: Self-pay | Admitting: Family Medicine

## 2016-11-30 MED ORDER — AMLODIPINE BESYLATE 10 MG PO TABS: 10.0000 mg | ORAL_TABLET | Freq: Every day | ORAL | 3 refills | Status: DC

## 2016-11-30 NOTE — Telephone Encounter (Signed)
Ok to send in new rx? Dr. Sarajane Jews - Please advise. Thanks!

## 2016-11-30 NOTE — Telephone Encounter (Signed)
I sent in a new rx for Amlodipine 10 mg to take daily. She can use up her 5 mg pills in the meantime

## 2017-02-06 DIAGNOSIS — L570 Actinic keratosis: Secondary | ICD-10-CM | POA: Diagnosis not present

## 2017-02-20 DIAGNOSIS — H5203 Hypermetropia, bilateral: Secondary | ICD-10-CM | POA: Diagnosis not present

## 2017-02-20 DIAGNOSIS — H3540 Unspecified peripheral retinal degeneration: Secondary | ICD-10-CM | POA: Diagnosis not present

## 2017-02-20 DIAGNOSIS — D3142 Benign neoplasm of left ciliary body: Secondary | ICD-10-CM | POA: Diagnosis not present

## 2017-02-20 DIAGNOSIS — H52222 Regular astigmatism, left eye: Secondary | ICD-10-CM | POA: Diagnosis not present

## 2017-03-24 ENCOUNTER — Encounter: Payer: Self-pay | Admitting: Family Medicine

## 2017-03-27 ENCOUNTER — Telehealth: Payer: Self-pay | Admitting: Family Medicine

## 2017-03-27 NOTE — Telephone Encounter (Signed)
Can you please let me know if the pt is eligible for a pneumonia vaccine?

## 2017-03-27 NOTE — Telephone Encounter (Signed)
I left a voice message with below information. 

## 2017-03-27 NOTE — Telephone Encounter (Signed)
Per Dr. Sarajane Jews pt has had 2 pneumonia vaccines and does not need any more.

## 2017-03-29 ENCOUNTER — Encounter: Payer: Self-pay | Admitting: Family Medicine

## 2017-03-29 ENCOUNTER — Ambulatory Visit (INDEPENDENT_AMBULATORY_CARE_PROVIDER_SITE_OTHER): Payer: Medicare Other | Admitting: Family Medicine

## 2017-03-29 VITALS — BP 130/80 | HR 79 | Temp 98.0°F | Ht 64.75 in | Wt 131.0 lb

## 2017-03-29 DIAGNOSIS — I10 Essential (primary) hypertension: Secondary | ICD-10-CM | POA: Diagnosis not present

## 2017-03-29 MED ORDER — DICLOFENAC SODIUM 75 MG PO TBEC: 75.0000 mg | DELAYED_RELEASE_TABLET | Freq: Two times a day (BID) | ORAL | 3 refills | Status: DC

## 2017-03-29 MED ORDER — METOPROLOL SUCCINATE ER 50 MG PO TB24: 50.0000 mg | ORAL_TABLET | Freq: Every day | ORAL | 3 refills | Status: DC

## 2017-03-29 NOTE — Progress Notes (Signed)
   Subjective:    Patient ID: Monique [name], female    DOB: 10/10/1946, 71 y.o.   MRN: [id number]  HPI Here for 2 weeks of ankle swelling and a rash around the ankles. She just got back from a week at the beach. Her BP is stable and she feels well.    Review of Systems  Constitutional: Negative.   Respiratory: Negative.   Cardiovascular: Positive for leg swelling. Negative for chest pain and palpitations.  Neurological: Negative.        Objective:   Physical Exam  Constitutional: She appears well-developed and well-nourished.  Cardiovascular: Normal rate, regular rhythm, normal heart sounds and intact distal pulses.   Pulmonary/Chest: Effort normal and breath sounds normal. No respiratory distress. She has no wheezes. She has no rales.  Musculoskeletal:  1+ edema in both ankles   Skin:  Fine red petechial rash over both ankles           Assessment & Plan:  The rash and swelling are side effects of the Amlodipine. We will stop this and substitute Metoprolol succinate. Recheck in one month.  Alysia Penna, MD

## 2017-03-29 NOTE — Patient Instructions (Signed)
WE NOW OFFER   [facility] FAST TRACK!!!  SAME DAY Appointments for ACUTE CARE  Such as: Sprains, Injuries, cuts, abrasions, rashes, muscle pain, joint pain, back pain Colds, flu, sore throats, headache, allergies, cough, fever  Ear pain, sinus and eye infections Abdominal pain, nausea, vomiting, diarrhea, upset stomach Animal/insect bites  3 Easy Ways to Schedule: Walk-In Scheduling Call in scheduling Mychart Sign-up: [email or web address]         

## 2017-05-08 ENCOUNTER — Ambulatory Visit (INDEPENDENT_AMBULATORY_CARE_PROVIDER_SITE_OTHER): Payer: Medicare Other | Admitting: Family Medicine

## 2017-05-08 ENCOUNTER — Encounter: Payer: Self-pay | Admitting: Family Medicine

## 2017-05-08 VITALS — BP 130/87 | HR 60 | Temp 97.9°F | Ht 64.75 in | Wt 130.0 lb

## 2017-05-08 DIAGNOSIS — I10 Essential (primary) hypertension: Secondary | ICD-10-CM | POA: Diagnosis not present

## 2017-05-08 MED ORDER — METOPROLOL SUCCINATE ER 50 MG PO TB24: 50.0000 mg | ORAL_TABLET | Freq: Every day | ORAL | 3 refills | Status: DC

## 2017-05-08 NOTE — Patient Instructions (Signed)
WE NOW OFFER   [facility] FAST TRACK!!!  SAME DAY Appointments for ACUTE CARE  Such as: Sprains, Injuries, cuts, abrasions, rashes, muscle pain, joint pain, back pain Colds, flu, sore throats, headache, allergies, cough, fever  Ear pain, sinus and eye infections Abdominal pain, nausea, vomiting, diarrhea, upset stomach Animal/insect bites  3 Easy Ways to Schedule: Walk-In Scheduling Call in scheduling Mychart Sign-up: [email or web address]         

## 2017-05-08 NOTE — Progress Notes (Signed)
   Subjective:    Patient ID: Monique [name], female    DOB: July 06, 1946, 71 y.o.   MRN: [id number]  HPI Here to follow up HTN and ankle swelling. We saw her 6 weeks ago and attributed the swelling to Amlodipine. This was stopped and we substituted Metoprolol for it. Since then    Review of Systems     Objective:   Physical Exam        Assessment & Plan:

## 2017-05-08 NOTE — Progress Notes (Signed)
   Subjective:    Patient ID: Monique [name], female    DOB: May 05, 1946, 71 y.o.   MRN: [id number]  HPI Here to recheck. At our last visit we stopped Amlodipine because it caused ankle swelling and switched to Metoprolol. She is very pleased with this. The swelling resolved and her BP is stable.    Review of Systems  Constitutional: Negative.   Respiratory: Negative.   Cardiovascular: Negative.   Neurological: Negative.        Objective:   Physical Exam  Constitutional: She is oriented to person, place, and time. She appears well-developed and well-nourished.  Cardiovascular: Normal rate, regular rhythm, normal heart sounds and intact distal pulses.   Pulmonary/Chest: Effort normal and breath sounds normal. No respiratory distress. She has no wheezes. She has no rales.  Musculoskeletal: She exhibits no edema.  Neurological: She is alert and oriented to person, place, and time.          Assessment & Plan:  Her HTN is now stable and she has no ankle edema. Recheck prn.  Alysia Penna, MD

## 2017-06-27 DIAGNOSIS — L821 Other seborrheic keratosis: Secondary | ICD-10-CM | POA: Diagnosis not present

## 2017-06-27 DIAGNOSIS — L814 Other melanin hyperpigmentation: Secondary | ICD-10-CM | POA: Diagnosis not present

## 2017-06-27 DIAGNOSIS — D225 Melanocytic nevi of trunk: Secondary | ICD-10-CM | POA: Diagnosis not present

## 2017-06-27 DIAGNOSIS — D1800 Hemangioma unspecified site: Secondary | ICD-10-CM | POA: Diagnosis not present

## 2017-06-27 DIAGNOSIS — Z85828 Personal history of other malignant neoplasm of skin: Secondary | ICD-10-CM | POA: Diagnosis not present

## 2017-06-27 DIAGNOSIS — L570 Actinic keratosis: Secondary | ICD-10-CM | POA: Diagnosis not present

## 2017-07-18 ENCOUNTER — Encounter: Payer: Self-pay | Admitting: Family Medicine

## 2017-08-07 ENCOUNTER — Ambulatory Visit (INDEPENDENT_AMBULATORY_CARE_PROVIDER_SITE_OTHER): Payer: Medicare Other | Admitting: Family Medicine

## 2017-08-07 ENCOUNTER — Encounter: Payer: Self-pay | Admitting: Family Medicine

## 2017-08-07 VITALS — BP 116/70 | Temp 97.9°F | Ht 64.75 in | Wt 133.0 lb

## 2017-08-07 DIAGNOSIS — Z23 Encounter for immunization: Secondary | ICD-10-CM

## 2017-08-07 DIAGNOSIS — I10 Essential (primary) hypertension: Secondary | ICD-10-CM

## 2017-08-07 DIAGNOSIS — E782 Mixed hyperlipidemia: Secondary | ICD-10-CM | POA: Diagnosis not present

## 2017-08-07 DIAGNOSIS — N3281 Overactive bladder: Secondary | ICD-10-CM | POA: Diagnosis not present

## 2017-08-07 DIAGNOSIS — M949 Disorder of cartilage, unspecified: Secondary | ICD-10-CM | POA: Diagnosis not present

## 2017-08-07 DIAGNOSIS — M899 Disorder of bone, unspecified: Secondary | ICD-10-CM | POA: Diagnosis not present

## 2017-08-07 LAB — CBC WITH DIFFERENTIAL/PLATELET
BASOS PCT: 0.5 % (ref 0.0–3.0)
Basophils Absolute: 0 10*3/uL (ref 0.0–0.1)
Eosinophils Absolute: 0.1 10*3/uL (ref 0.0–0.7)
Eosinophils Relative: 1.3 % (ref 0.0–5.0)
HEMATOCRIT: 40.9 % (ref 36.0–46.0)
Hemoglobin: 14.2 g/dL (ref 12.0–15.0)
LYMPHS ABS: 2.4 10*3/uL (ref 0.7–4.0)
LYMPHS PCT: 37.1 % (ref 12.0–46.0)
MCHC: 34.7 g/dL (ref 30.0–36.0)
MCV: 100.4 fl — AB (ref 78.0–100.0)
MONOS PCT: 5.9 % (ref 3.0–12.0)
Monocytes Absolute: 0.4 10*3/uL (ref 0.1–1.0)
NEUTROS ABS: 3.5 10*3/uL (ref 1.4–7.7)
NEUTROS PCT: 55.2 % (ref 43.0–77.0)
PLATELETS: 252 10*3/uL (ref 150.0–400.0)
RBC: 4.08 Mil/uL (ref 3.87–5.11)
RDW: 12.3 % (ref 11.5–15.5)
WBC: 6.4 10*3/uL (ref 4.0–10.5)

## 2017-08-07 LAB — HEPATIC FUNCTION PANEL
ALBUMIN: 4.3 g/dL (ref 3.5–5.2)
ALK PHOS: 47 U/L (ref 39–117)
ALT: 20 U/L (ref 0–35)
AST: 25 U/L (ref 0–37)
BILIRUBIN TOTAL: 1.2 mg/dL (ref 0.2–1.2)
Bilirubin, Direct: 0.2 mg/dL (ref 0.0–0.3)
Total Protein: 6.5 g/dL (ref 6.0–8.3)

## 2017-08-07 LAB — POC URINALSYSI DIPSTICK (AUTOMATED)
BILIRUBIN UA: NEGATIVE
Blood, UA: NEGATIVE
Clarity, UA: NEGATIVE
Color, UA: NEGATIVE
GLUCOSE UA: NEGATIVE
KETONES UA: NEGATIVE
LEUKOCYTES UA: NEGATIVE
NITRITE UA: NEGATIVE
PH UA: 7.5 (ref 5.0–8.0)
Protein, UA: NEGATIVE
Spec Grav, UA: 1.01 (ref 1.010–1.025)
Urobilinogen, UA: 0.2 E.U./dL

## 2017-08-07 LAB — LIPID PANEL
Cholesterol: 170 mg/dL (ref 0–200)
HDL: 56.9 mg/dL (ref >39.00)
LDL Cholesterol: 78 mg/dL (ref 0–99)
NonHDL: 112.85
TRIGLYCERIDES: 173 mg/dL — AB (ref 0.0–149.0)
Total CHOL/HDL Ratio: 3
VLDL: 34.6 mg/dL (ref 0.0–40.0)

## 2017-08-07 LAB — BASIC METABOLIC PANEL
BUN: 10 mg/dL (ref 6–23)
CHLORIDE: 96 meq/L (ref 96–112)
CO2: 31 mEq/L (ref 19–32)
Calcium: 9.6 mg/dL (ref 8.4–10.5)
Creatinine, Ser: 0.61 mg/dL (ref 0.40–1.20)
GFR: 103.32 mL/min (ref >60.00)
GLUCOSE: 88 mg/dL (ref 70–99)
POTASSIUM: 4.6 meq/L (ref 3.5–5.1)
Sodium: 133 mEq/L — ABNORMAL LOW (ref 135–145)

## 2017-08-07 LAB — TSH: TSH: 2.01 u[IU]/mL (ref 0.35–4.50)

## 2017-08-07 MED ORDER — LOSARTAN POTASSIUM-HCTZ 100-25 MG PO TABS: 1.0000 | ORAL_TABLET | Freq: Every day | ORAL | 3 refills | Status: DC

## 2017-08-07 MED ORDER — POTASSIUM CHLORIDE ER 10 MEQ PO TBCR
EXTENDED_RELEASE_TABLET | ORAL | 3 refills | Status: DC
Start: 2017-08-07 — End: 2018-08-28

## 2017-08-07 NOTE — Progress Notes (Signed)
   Subjective:    Patient ID: Monique [name], female    DOB: 09-Feb-1946, 71 y.o.   MRN: [id number]  HPI Here to follow up on issues. She feels great and she exercises every day. She still has some nocturnal leg cramps. She tried taking magnesium tablets but they gave her loose stools. Her BP is stable.    Review of Systems  Constitutional: Negative.   HENT: Negative.   Eyes: Negative.   Respiratory: Negative.   Cardiovascular: Negative.   Gastrointestinal: Negative.   Genitourinary: Negative for decreased urine volume, difficulty urinating, dyspareunia, dysuria, enuresis, flank pain, frequency, hematuria, pelvic pain and urgency.  Musculoskeletal: Negative.   Skin: Negative.   Neurological: Negative.   Psychiatric/Behavioral: Negative.        Objective:   Physical Exam  Constitutional: She is oriented to person, place, and time. She appears well-developed and well-nourished. No distress.  HENT:  Head: Normocephalic and atraumatic.  Right Ear: External ear normal.  Left Ear: External ear normal.  Nose: Nose normal.  Mouth/Throat: Oropharynx is clear and moist. No oropharyngeal exudate.  Eyes: Pupils are equal, round, and reactive to light. Conjunctivae and EOM are normal. No scleral icterus.  Neck: Normal range of motion. Neck supple. No JVD present. No thyromegaly present.  Cardiovascular: Normal rate, regular rhythm, normal heart sounds and intact distal pulses.  Exam reveals no gallop and no friction rub.   No murmur heard. Pulmonary/Chest: Effort normal and breath sounds normal. No respiratory distress. She has no wheezes. She has no rales. She exhibits no tenderness.  Abdominal: Soft. Bowel sounds are normal. She exhibits no distension and no mass. There is no tenderness. There is no rebound and no guarding.  Musculoskeletal: Normal range of motion. She exhibits no edema or tenderness.  Lymphadenopathy:    She has no cervical adenopathy.  Neurological: She is alert and  oriented to person, place, and time. She has normal reflexes. No cranial nerve deficit. She exhibits normal muscle tone. Coordination normal.  Skin: Skin is warm and dry. No rash noted. No erythema.  Psychiatric: She has a normal mood and affect. Her behavior is normal. Judgment and thought content normal.          Assessment & Plan:  Her HTN is stable. We will get fasting labs to check her lipids, potassium level, etc. For the leg cramps she will try drinking a glass of tonic water every night. Sh ewill be due for a colonoscopy next spring.  Alysia Penna, MD

## 2017-08-07 NOTE — Patient Instructions (Signed)
WE NOW OFFER   [facility] FAST TRACK!!!  SAME DAY Appointments for ACUTE CARE  Such as: Sprains, Injuries, cuts, abrasions, rashes, muscle pain, joint pain, back pain Colds, flu, sore throats, headache, allergies, cough, fever  Ear pain, sinus and eye infections Abdominal pain, nausea, vomiting, diarrhea, upset stomach Animal/insect bites  3 Easy Ways to Schedule: Walk-In Scheduling Call in scheduling Mychart Sign-up: [email or web address]         

## 2017-10-30 ENCOUNTER — Other Ambulatory Visit: Payer: Self-pay | Admitting: Family Medicine

## 2017-10-30 DIAGNOSIS — Z1231 Encounter for screening mammogram for malignant neoplasm of breast: Secondary | ICD-10-CM

## 2017-11-28 ENCOUNTER — Ambulatory Visit (INDEPENDENT_AMBULATORY_CARE_PROVIDER_SITE_OTHER): Payer: Medicare Other

## 2017-11-28 DIAGNOSIS — Z1231 Encounter for screening mammogram for malignant neoplasm of breast: Secondary | ICD-10-CM

## 2017-12-20 ENCOUNTER — Encounter: Payer: Self-pay | Admitting: Gastroenterology

## 2017-12-27 ENCOUNTER — Encounter: Payer: Self-pay | Admitting: Gastroenterology

## 2018-01-30 DIAGNOSIS — H25813 Combined forms of age-related cataract, bilateral: Secondary | ICD-10-CM | POA: Diagnosis not present

## 2018-01-30 DIAGNOSIS — D3142 Benign neoplasm of left ciliary body: Secondary | ICD-10-CM | POA: Diagnosis not present

## 2018-01-30 DIAGNOSIS — H52222 Regular astigmatism, left eye: Secondary | ICD-10-CM | POA: Diagnosis not present

## 2018-01-30 DIAGNOSIS — H5203 Hypermetropia, bilateral: Secondary | ICD-10-CM | POA: Diagnosis not present

## 2018-02-13 ENCOUNTER — Other Ambulatory Visit: Payer: Self-pay

## 2018-02-13 ENCOUNTER — Ambulatory Visit (AMBULATORY_SURGERY_CENTER): Payer: Self-pay

## 2018-02-13 VITALS — Ht 65.0 in | Wt 135.0 lb

## 2018-02-13 DIAGNOSIS — Z86010 Personal history of colonic polyps: Secondary | ICD-10-CM

## 2018-02-13 MED ORDER — PEG 3350-KCL-NA BICARB-NACL 420 G PO SOLR: 4000.0000 mL | Freq: Once | ORAL | 0 refills | Status: AC

## 2018-02-13 NOTE — Progress Notes (Signed)
Denies allergies to eggs or soy products. Denies complication of anesthesia or sedation. Denies use of weight loss medication. Denies use of O2.   Emmi instructions declined.  

## 2018-02-17 ENCOUNTER — Encounter: Payer: Self-pay | Admitting: Gastroenterology

## 2018-02-28 ENCOUNTER — Encounter: Payer: Self-pay | Admitting: Gastroenterology

## 2018-02-28 ENCOUNTER — Ambulatory Visit (AMBULATORY_SURGERY_CENTER): Payer: Medicare Other | Admitting: Gastroenterology

## 2018-02-28 ENCOUNTER — Other Ambulatory Visit: Payer: Self-pay

## 2018-02-28 VITALS — BP 154/63 | HR 50 | Temp 98.6°F | Resp 11 | Ht 65.0 in | Wt 135.0 lb

## 2018-02-28 DIAGNOSIS — I10 Essential (primary) hypertension: Secondary | ICD-10-CM | POA: Diagnosis not present

## 2018-02-28 DIAGNOSIS — Z86010 Personal history of colonic polyps: Secondary | ICD-10-CM

## 2018-02-28 DIAGNOSIS — K635 Polyp of colon: Secondary | ICD-10-CM

## 2018-02-28 DIAGNOSIS — D123 Benign neoplasm of transverse colon: Secondary | ICD-10-CM | POA: Diagnosis not present

## 2018-02-28 MED ORDER — SODIUM CHLORIDE 0.9 % IV SOLN: 500.0000 mL | Freq: Once | INTRAVENOUS | Status: DC

## 2018-02-28 NOTE — Patient Instructions (Signed)
YOU HAD AN ENDOSCOPIC PROCEDURE TODAY AT THE [facility]:   Refer to the procedure report that was given to you for any specific questions about what was found during the examination.  If the procedure report does not answer your questions, please call your gastroenterologist to clarify.  If you requested that your care partner not be given the details of your procedure findings, then the procedure report has been included in a sealed envelope for you to review at your convenience later.  YOU SHOULD EXPECT: Some feelings of bloating in the abdomen. Passage of more gas than usual.  Walking can help get rid of the air that was put into your GI tract during the procedure and reduce the bloating. If you had a lower endoscopy (such as a colonoscopy or flexible sigmoidoscopy) you may notice spotting of blood in your stool or on the toilet paper. If you underwent a bowel prep for your procedure, you may not have a normal bowel movement for a few days.  Please Note:  You might notice some irritation and congestion in your nose or some drainage.  This is from the oxygen used during your procedure.  There is no need for concern and it should clear up in a day or so.  SYMPTOMS TO REPORT IMMEDIATELY:   Following lower endoscopy (colonoscopy or flexible sigmoidoscopy):  Excessive amounts of blood in the stool  Significant tenderness or worsening of abdominal pains  Swelling of the abdomen that is new, acute  Fever of 100F or higher  For urgent or emergent issues, a gastroenterologist can be reached at any hour by calling [phone].   DIET:  We do recommend a small meal at first, but then you may proceed to your regular diet.  Drink plenty of fluids but you should avoid alcoholic beverages for 24 hours.  ACTIVITY:  You should plan to take it easy for the rest of today and you should NOT DRIVE or use heavy machinery until tomorrow (because of the sedation medicines used during the test).     FOLLOW UP: Our staff will call the number listed on your records the next business day following your procedure to check on you and address any questions or concerns that you may have regarding the information given to you following your procedure. If we do not reach you, we will leave a message.  However, if you are feeling well and you are not experiencing any problems, there is no need to return our call.  We will assume that you have returned to your regular daily activities without incident.  If any biopsies were taken you will be contacted by phone or by letter within the next 1-3 weeks.  Please call us at [phone] if you have not heard about the biopsies in 3 weeks.    SIGNATURES/CONFIDENTIALITY: You and/or your care partner have signed paperwork which will be entered into your electronic medical record.  These signatures attest to the fact that that the information above on your After Visit Summary has been reviewed and is understood.  Full responsibility of the confidentiality of this discharge information lies with you and/or your care-partner. 

## 2018-02-28 NOTE — Progress Notes (Signed)
Called to room to assist during endoscopic procedure.  Patient ID and intended procedure confirmed with present staff. Received instructions for my participation in the procedure from the performing physician.  

## 2018-02-28 NOTE — Progress Notes (Signed)
Report to PACU, RN, vss, BBS= Clear.  

## 2018-02-28 NOTE — Op Note (Signed)
[facility] Patient Name: Monique [name] Procedure Date: 02/28/2018 11:01 AM MRN: [id number] Endoscopist: Milus Banister , MD Age: 72 Referring MD:  Date of Birth: 01-21-1946 Gender: Female Account #: [id number] Procedure:                Colonoscopy Indications:              High risk colon cancer surveillance: Personal                            history of colonic polyps; one subCM adenoma                            removed 2014, also mother had colon cancer Medicines:                Monitored Anesthesia Care Procedure:                Pre-Anesthesia Assessment:                           - Prior to the procedure, a History and Physical                            was performed, and patient medications and                            allergies were reviewed. The patient's tolerance of                            previous anesthesia was also reviewed. The risks                            and benefits of the procedure and the sedation                            options and risks were discussed with the patient.                            All questions were answered, and informed consent                            was obtained. Prior Anticoagulants: The patient has                            taken no previous anticoagulant or antiplatelet                            agents. ASA Grade Assessment: II - A patient with                            mild systemic disease. After reviewing the risks                            and benefits, the patient was deemed in  satisfactory condition to undergo the procedure.                           After obtaining informed consent, the colonoscope                            was passed under direct vision. Throughout the                            procedure, the patient's blood pressure, pulse, and                            oxygen saturations were monitored continuously. The                            Colonoscope was introduced  through the anus and                            advanced to the the cecum, identified by                            appendiceal orifice and ileocecal valve. The                            colonoscopy was performed without difficulty. The                            patient tolerated the procedure well. The quality                            of the bowel preparation was good. The ileocecal                            valve, appendiceal orifice, and rectum were                            photographed. Scope In: 11:09:00 AM Scope Out: 11:47:04 AM Scope Withdrawal Time: 0 hours 33 minutes 28 seconds  Total Procedure Duration: 0 hours 38 minutes 4 seconds  Findings:                 A 15 mm polyp was found in the hepatic flexure. The                            polyp was sessile. The polyp was only able to be                            partially resected; was unable to ever adequately                            grasp the polyp mucosa with a variety of snares                            (tried 4 different snares in addition  to saline                            lift). I resected about 1/4 of the polyp in total                            but the rest would slip through all the snares I                            used and so the area was tattooed with an injection                            of Spot (carbon black) to aid in future                            localization.                           The exam was otherwise without abnormality on                            direct and retroflexion views. Complications:            No immediate complications. Estimated blood loss:                            None. Estimated Blood Loss:     Estimated blood loss: none. Impression:               - One 15 mm polyp at the hepatic flexure, partially                            resected and removed. Tattooed.                           - The examination was otherwise normal on direct                            and  retroflexion views. Recommendation:           - Patient has a contact number available for                            emergencies. The signs and symptoms of potential                            delayed complications were discussed with the                            patient. Return to normal activities tomorrow.                            Written discharge instructions were provided to the                            patient.                           -  Resume previous diet.                           - Continue present medications.                           - Await pathology results. Will likely recommend                            repeat colonoscopy in 6-8 weeks after inquiring                            about specialty snares (thicker braids, barbed?). Milus Banister, MD 02/28/2018 11:55:51 AM This report has been signed electronically.

## 2018-03-03 ENCOUNTER — Telehealth: Payer: Self-pay

## 2018-03-03 NOTE — Telephone Encounter (Signed)
Left message

## 2018-03-13 ENCOUNTER — Telehealth: Payer: Self-pay | Admitting: Gastroenterology

## 2018-03-13 NOTE — Telephone Encounter (Signed)
Monique Banister, MD  Timothy Lasso, RN Cc: Monique Relic, RN; Monique Banister, MD         Monique [name],  Can you please call her, let her know that the small pieces of polyp which were removed show no concerning signs of cancer. This is simply a polyp. Letter now I am working with snare makers to locate the ideal type of device to attempt removal again. There is not an emergency here but I should have a better plan of action within the next week or so. Tell her I will contact her at that point.   Santiago Glad, no result note is needed.

## 2018-03-13 NOTE — Telephone Encounter (Signed)
Notes recorded by Timothy Lasso, RN on 03/10/2018 at 12:55 PM EDT Patient called. Left message for patient to call back. She will be out of town until Friday, family member will give her the message ------

## 2018-03-13 NOTE — Telephone Encounter (Signed)
Monique [name], Can you call her and reschedule colonoscopy at [facility] for 5-6 weeks from now (not sooner so that [employer] rep can definitely be there).  Book as a 1 hour case please.  Please let me know the date, time and I will coordinate with [employer].  Let Ms. Aument know that I plan to have multiple snare options available.  [name], See above. Can you make sure that all our [employer] options are available (our usual snares, do not need to get anything special from them, after talking with Tonya I think we have pretty much all they have to offer).   Thanks

## 2018-03-17 NOTE — Telephone Encounter (Signed)
The pt has been advised and previsit and colon scheduled for 7/31 11 am.    FYI Dr Ardis Hughs

## 2018-05-08 ENCOUNTER — Other Ambulatory Visit: Payer: Self-pay | Admitting: Family Medicine

## 2018-05-21 ENCOUNTER — Other Ambulatory Visit: Payer: Self-pay

## 2018-05-21 ENCOUNTER — Ambulatory Visit (AMBULATORY_SURGERY_CENTER): Payer: Self-pay

## 2018-05-21 VITALS — Ht 65.0 in | Wt 133.4 lb

## 2018-05-21 DIAGNOSIS — Z800 Family history of malignant neoplasm of digestive organs: Secondary | ICD-10-CM

## 2018-05-21 DIAGNOSIS — Z86010 Personal history of colonic polyps: Secondary | ICD-10-CM

## 2018-05-21 MED ORDER — PEG 3350-KCL-NA BICARB-NACL 420 G PO SOLR
4000.0000 mL | Freq: Once | ORAL | 0 refills | Status: AC
Start: 2018-05-21 — End: 2018-05-21

## 2018-05-21 NOTE — Progress Notes (Signed)
No egg or soy allergy known to patient  No issues with past sedation with any surgeries  or procedures, no intubation problems  No diet pills per patient No home 02 use per patient  No blood thinners per patient  Pt denies issues with constipation  No A fib or A flutter  EMMI video sent to pt's e mail , pt declined    

## 2018-05-28 ENCOUNTER — Encounter: Payer: Self-pay | Admitting: Gastroenterology

## 2018-05-28 ENCOUNTER — Ambulatory Visit (AMBULATORY_SURGERY_CENTER): Payer: Medicare Other | Admitting: Gastroenterology

## 2018-05-28 VITALS — BP 159/75 | HR 46 | Temp 98.0°F | Resp 11 | Ht 65.0 in | Wt 133.0 lb

## 2018-05-28 DIAGNOSIS — D123 Benign neoplasm of transverse colon: Secondary | ICD-10-CM | POA: Diagnosis not present

## 2018-05-28 DIAGNOSIS — Z86010 Personal history of colonic polyps: Secondary | ICD-10-CM | POA: Diagnosis not present

## 2018-05-28 DIAGNOSIS — I10 Essential (primary) hypertension: Secondary | ICD-10-CM | POA: Diagnosis not present

## 2018-05-28 DIAGNOSIS — K635 Polyp of colon: Secondary | ICD-10-CM

## 2018-05-28 HISTORY — PX: COLONOSCOPY: SHX174

## 2018-05-28 MED ORDER — SODIUM CHLORIDE 0.9 % IV SOLN: 500.0000 mL | Freq: Once | INTRAVENOUS | Status: DC

## 2018-05-28 NOTE — Progress Notes (Signed)
Called to room to assist during endoscopic procedure.  Patient ID and intended procedure confirmed with present staff. Received instructions for my participation in the procedure from the performing physician.  

## 2018-05-28 NOTE — Progress Notes (Signed)
Report to PACU, RN, vss, BBS= Clear.  

## 2018-05-28 NOTE — Patient Instructions (Signed)
  Thank you for allowing [place] to care for you today!  Await pathology results by mail, 2-3 weeks.  Handout for polyps given.      YOU HAD AN ENDOSCOPIC PROCEDURE TODAY AT [facility]:   Refer to the procedure report that was given to you for any specific questions about what was found during the examination.  If the procedure report does not answer your questions, please call your gastroenterologist to clarify.  If you requested that your care partner not be given the details of your procedure findings, then the procedure report has been included in a sealed envelope for you to review at your convenience later.  YOU SHOULD EXPECT: Some feelings of bloating in the abdomen. Passage of more gas than usual.  Walking can help get rid of the air that was put into your GI tract during the procedure and reduce the bloating. If you had a lower endoscopy (such as a colonoscopy or flexible sigmoidoscopy) you may notice spotting of blood in your stool or on the toilet paper. If you underwent a bowel prep for your procedure, you may not have a normal bowel movement for a few days.  Please Note:  You might notice some irritation and congestion in your nose or some drainage.  This is from the oxygen used during your procedure.  There is no need for concern and it should clear up in a day or so.  SYMPTOMS TO REPORT IMMEDIATELY:   Following lower endoscopy (colonoscopy or flexible sigmoidoscopy):  Excessive amounts of blood in the stool  Significant tenderness or worsening of abdominal pains  Swelling of the abdomen that is new, acute  Fever of 100F or higher    For urgent or emergent issues, a gastroenterologist can be reached at any hour by calling [phone].   DIET:  We do recommend a small meal at first, but then you may proceed to your regular diet.  Drink plenty of fluids but you should avoid alcoholic beverages for 24 hours.  ACTIVITY:  You should plan to take it easy for  the rest of today and you should NOT DRIVE or use heavy machinery until tomorrow (because of the sedation medicines used during the test).    FOLLOW UP: Our staff will call the number listed on your records the next business day following your procedure to check on you and address any questions or concerns that you may have regarding the information given to you following your procedure. If we do not reach you, we will leave a message.  However, if you are feeling well and you are not experiencing any problems, there is no need to return our call.  We will assume that you have returned to your regular daily activities without incident.  If any biopsies were taken you will be contacted by phone or by letter within the next 1-3 weeks.  Please call us at [phone] if you have not heard about the biopsies in 3 weeks.    SIGNATURES/CONFIDENTIALITY: You and/or your care partner have signed paperwork which will be entered into your electronic medical record.  These signatures attest to the fact that that the information above on your After Visit Summary has been reviewed and is understood.  Full responsibility of the confidentiality of this discharge information lies with you and/or your care-partner.

## 2018-05-28 NOTE — Op Note (Signed)
[place] Patient Name: Monique [name] Procedure Date: 05/28/2018 10:18 AM MRN: [id number] Endoscopist: Milus Banister , MD Age: 72 Referring MD:  Date of Birth: 1946-07-26 Gender: Female Account #: [id number] Procedure:                Colonoscopy Indications:              High risk colon cancer surveillance: Personal                            history of colonic polyps; TA at hepatic flexure                            partially removed 3 months ago Medicines:                Monitored Anesthesia Care Procedure:                Pre-Anesthesia Assessment:                           - Prior to the procedure, a History and Physical                            was performed, and patient medications and                            allergies were reviewed. The patient's tolerance of                            previous anesthesia was also reviewed. The risks                            and benefits of the procedure and the sedation                            options and risks were discussed with the patient.                            All questions were answered, and informed consent                            was obtained. Prior Anticoagulants: The patient has                            taken no previous anticoagulant or antiplatelet                            agents. ASA Grade Assessment: II - A patient with                            mild systemic disease. After reviewing the risks                            and benefits, the patient was deemed in  satisfactory condition to undergo the procedure.                           After obtaining informed consent, the colonoscope                            was passed under direct vision. Throughout the                            procedure, the patient's blood pressure, pulse, and                            oxygen saturations were monitored continuously. The                            Colonoscope was introduced through  the anus and                            advanced to the the cecum, identified by                            appendiceal orifice and ileocecal valve. The                            colonoscopy was performed without difficulty. The                            patient tolerated the procedure well. The quality                            of the bowel preparation was excellent. Scope In: 10:29:19 AM Scope Out: 10:49:30 AM Scope Withdrawal Time: 0 hours 16 minutes 24 seconds  Total Procedure Duration: 0 hours 20 minutes 11 seconds  Findings:                 The site of the recent partial polypectomy at the                            hepatic flexure was easily located. Previous SPOT                            tattoo noted. There was about 26mm of residual                            polypoid mucosa at the site. I performed saline                            submucosal injection to lift the polyp and then                            removed it with piecemeal resection using a [employer]                            [employer] Cap-Cold 31mm snare.  There was a small                            amount of residual adenomatous mucosa along one lip                            of the polypectomy defect which I ablated with                            careful snare tip cautery (using a different snare).                           The exam was otherwise without abnormality on                            direct and retroflexion views. Complications:            No immediate complications. Estimated blood loss:                            None. Estimated Blood Loss:     Estimated blood loss: none. Impression:               - The residual polyp tissue at the hepatic flexure                            was removed with saline lifting followed by                            piecemeal cold snare and then snare tip cautery.                           - The examination was otherwise normal on direct                            and  retroflexion views. Recommendation:           - Patient has a contact number available for                            emergencies. The signs and symptoms of potential                            delayed complications were discussed with the                            patient. Return to normal activities tomorrow.                            Written discharge instructions were provided to the                            patient.                           - Resume previous diet.                           -  Continue present medications.                           - Repeat colonoscopy is recommended. The                            colonoscopy date will be determined after pathology                            results from today's exam become available for                            review (probably 12 months). Milus Banister, MD 05/28/2018 10:59:46 AM This report has been signed electronically.

## 2018-05-28 NOTE — Progress Notes (Signed)
Patient gives consent to observer being present during procedure.

## 2018-05-28 NOTE — Progress Notes (Signed)
Pt's states no medical or surgical changes since previsit or office visit.Patient does not consent to observer being present during procedure

## 2018-05-29 ENCOUNTER — Telehealth: Payer: Self-pay

## 2018-05-29 NOTE — Telephone Encounter (Signed)
  Follow up Call-  Call back number 05/28/2018 02/28/2018  Post procedure Call Back phone  # [phone]  Permission to leave phone message Yes Yes  Some recent data might be hidden     Patient questions:  Do you have a fever, pain , or abdominal swelling? No. Pain Score  0 *  Have you tolerated food without any problems? Yes.    Have you been able to return to your normal activities? Yes.    Do you have any questions about your discharge instructions: Diet   No. Medications  No. Follow up visit  No.  Do you have questions or concerns about your Care? No.  Actions: * If pain score is 4 or above: No action needed, pain <4.  Pt. Out for a walk, pt.'s husband Elberta Fortis reported pt. Was doing fine.

## 2018-06-02 ENCOUNTER — Other Ambulatory Visit: Payer: Self-pay

## 2018-06-26 DIAGNOSIS — L570 Actinic keratosis: Secondary | ICD-10-CM | POA: Diagnosis not present

## 2018-06-26 DIAGNOSIS — C44311 Basal cell carcinoma of skin of nose: Secondary | ICD-10-CM | POA: Diagnosis not present

## 2018-06-26 DIAGNOSIS — L821 Other seborrheic keratosis: Secondary | ICD-10-CM | POA: Diagnosis not present

## 2018-06-26 DIAGNOSIS — L814 Other melanin hyperpigmentation: Secondary | ICD-10-CM | POA: Diagnosis not present

## 2018-06-26 DIAGNOSIS — Z85828 Personal history of other malignant neoplasm of skin: Secondary | ICD-10-CM | POA: Diagnosis not present

## 2018-06-26 DIAGNOSIS — D485 Neoplasm of uncertain behavior of skin: Secondary | ICD-10-CM | POA: Diagnosis not present

## 2018-06-26 DIAGNOSIS — D1800 Hemangioma unspecified site: Secondary | ICD-10-CM | POA: Diagnosis not present

## 2018-06-26 DIAGNOSIS — D225 Melanocytic nevi of trunk: Secondary | ICD-10-CM | POA: Diagnosis not present

## 2018-08-08 ENCOUNTER — Encounter: Payer: Self-pay | Admitting: Family Medicine

## 2018-08-08 ENCOUNTER — Ambulatory Visit (INDEPENDENT_AMBULATORY_CARE_PROVIDER_SITE_OTHER): Payer: Medicare Other | Admitting: Family Medicine

## 2018-08-08 VITALS — BP 100/70 | HR 53 | Temp 97.6°F | Ht 65.0 in | Wt 132.0 lb

## 2018-08-08 DIAGNOSIS — I10 Essential (primary) hypertension: Secondary | ICD-10-CM

## 2018-08-08 DIAGNOSIS — E782 Mixed hyperlipidemia: Secondary | ICD-10-CM

## 2018-08-08 DIAGNOSIS — N3281 Overactive bladder: Secondary | ICD-10-CM

## 2018-08-08 DIAGNOSIS — M810 Age-related osteoporosis without current pathological fracture: Secondary | ICD-10-CM | POA: Diagnosis not present

## 2018-08-08 DIAGNOSIS — Z23 Encounter for immunization: Secondary | ICD-10-CM | POA: Diagnosis not present

## 2018-08-08 LAB — CBC WITH DIFFERENTIAL/PLATELET
Basophils Absolute: 0 10*3/uL (ref 0.0–0.1)
Basophils Relative: 0.5 % (ref 0.0–3.0)
EOS PCT: 1.7 % (ref 0.0–5.0)
Eosinophils Absolute: 0.1 10*3/uL (ref 0.0–0.7)
HCT: 40.6 % (ref 36.0–46.0)
Hemoglobin: 14.3 g/dL (ref 12.0–15.0)
LYMPHS ABS: 2.5 10*3/uL (ref 0.7–4.0)
LYMPHS PCT: 39 % (ref 12.0–46.0)
MCHC: 35.3 g/dL (ref 30.0–36.0)
MCV: 99.8 fl (ref 78.0–100.0)
MONOS PCT: 5.8 % (ref 3.0–12.0)
Monocytes Absolute: 0.4 10*3/uL (ref 0.1–1.0)
NEUTROS PCT: 53 % (ref 43.0–77.0)
Neutro Abs: 3.4 10*3/uL (ref 1.4–7.7)
Platelets: 290 10*3/uL (ref 150.0–400.0)
RBC: 4.07 Mil/uL (ref 3.87–5.11)
RDW: 12.1 % (ref 11.5–15.5)
WBC: 6.4 10*3/uL (ref 4.0–10.5)

## 2018-08-08 LAB — LIPID PANEL
CHOL/HDL RATIO: 3
Cholesterol: 192 mg/dL (ref 0–200)
HDL: 62.5 mg/dL (ref >39.00)
LDL CALC: 100 mg/dL — AB (ref 0–99)
NONHDL: 129.33
TRIGLYCERIDES: 149 mg/dL (ref 0.0–149.0)
VLDL: 29.8 mg/dL (ref 0.0–40.0)

## 2018-08-08 LAB — POC URINALSYSI DIPSTICK (AUTOMATED)
BILIRUBIN UA: NEGATIVE
Blood, UA: NEGATIVE
Glucose, UA: NEGATIVE
KETONES UA: NEGATIVE
LEUKOCYTES UA: NEGATIVE
Nitrite, UA: NEGATIVE
PH UA: 7.5 (ref 5.0–8.0)
Protein, UA: NEGATIVE
Spec Grav, UA: 1.01 (ref 1.010–1.025)
Urobilinogen, UA: 0.2 E.U./dL

## 2018-08-08 LAB — BASIC METABOLIC PANEL
BUN: 12 mg/dL (ref 6–23)
CHLORIDE: 97 meq/L (ref 96–112)
CO2: 31 mEq/L (ref 19–32)
Calcium: 9.7 mg/dL (ref 8.4–10.5)
Creatinine, Ser: 0.74 mg/dL (ref 0.40–1.20)
GFR: 82.43 mL/min (ref >60.00)
Glucose, Bld: 91 mg/dL (ref 70–99)
POTASSIUM: 5.1 meq/L (ref 3.5–5.1)
SODIUM: 136 meq/L (ref 135–145)

## 2018-08-08 LAB — HEPATIC FUNCTION PANEL
ALK PHOS: 48 U/L (ref 39–117)
ALT: 20 U/L (ref 0–35)
AST: 26 U/L (ref 0–37)
Albumin: 4.6 g/dL (ref 3.5–5.2)
BILIRUBIN DIRECT: 0.2 mg/dL (ref 0.0–0.3)
BILIRUBIN TOTAL: 0.9 mg/dL (ref 0.2–1.2)
Total Protein: 7.1 g/dL (ref 6.0–8.3)

## 2018-08-08 LAB — TSH: TSH: 1.93 u[IU]/mL (ref 0.35–4.50)

## 2018-08-08 NOTE — Progress Notes (Signed)
   Subjective:    Patient ID: Monique [name], female    DOB: 05-30-1946, 72 y.o.   MRN: [id number]  HPI Here to follow up on issues. She feels great. Her BP at home is stable. She walks 6-7 miles almost every day. Her last DEXA was 5 years ago.    Review of Systems  Constitutional: Negative.  Negative for activity change, appetite change, diaphoresis, fatigue, fever and unexpected weight change.  HENT: Negative.  Negative for congestion, ear pain, hearing loss, nosebleeds, sore throat, tinnitus, trouble swallowing and voice change.   Eyes: Negative.  Negative for photophobia, pain, discharge, redness and visual disturbance.  Respiratory: Negative.  Negative for apnea, cough, choking, chest tightness, shortness of breath, wheezing and stridor.   Cardiovascular: Negative.  Negative for chest pain, palpitations and leg swelling.  Gastrointestinal: Negative.  Negative for abdominal distention, abdominal pain, blood in stool, constipation, diarrhea, nausea, rectal pain and vomiting.  Genitourinary: Negative.  Negative for decreased urine volume, difficulty urinating, dyspareunia, dysuria, enuresis, flank pain, frequency, hematuria, menstrual problem, pelvic pain, urgency, vaginal bleeding, vaginal discharge and vaginal pain.  Musculoskeletal: Negative.  Negative for arthralgias, back pain, gait problem, joint swelling, myalgias, neck pain and neck stiffness.  Skin: Negative.  Negative for color change, pallor, rash and wound.  Neurological: Negative.  Negative for dizziness, tremors, seizures, syncope, speech difficulty, weakness, light-headedness, numbness and headaches.  Hematological: Negative for adenopathy. Does not bruise/bleed easily.  Psychiatric/Behavioral: Negative.  Negative for agitation, behavioral problems, confusion, dysphoric mood, hallucinations and sleep disturbance. The patient is not nervous/anxious.        Objective:   Physical Exam  Constitutional: She is oriented to person,  place, and time. She appears well-developed and well-nourished. No distress.  HENT:  Head: Normocephalic and atraumatic.  Right Ear: External ear normal.  Left Ear: External ear normal.  Nose: Nose normal.  Mouth/Throat: Oropharynx is clear and moist. No oropharyngeal exudate.  Eyes: Pupils are equal, round, and reactive to light. Conjunctivae and EOM are normal. No scleral icterus.  Neck: Normal range of motion. Neck supple. No JVD present. No thyromegaly present.  Cardiovascular: Normal rate, regular rhythm, normal heart sounds and intact distal pulses. Exam reveals no gallop and no friction rub.  No murmur heard. Pulmonary/Chest: Effort normal and breath sounds normal. No respiratory distress. She has no wheezes. She has no rales. She exhibits no tenderness.  Abdominal: Soft. Bowel sounds are normal. She exhibits no distension and no mass. There is no tenderness. There is no rebound and no guarding.  Musculoskeletal: Normal range of motion. She exhibits no edema or tenderness.  Lymphadenopathy:    She has no cervical adenopathy.  Neurological: She is alert and oriented to person, place, and time. She has normal reflexes. She displays normal reflexes. No cranial nerve deficit. She exhibits normal muscle tone. Coordination normal.  Skin: Skin is warm and dry. No rash noted. No erythema.  Psychiatric: She has a normal mood and affect. Her behavior is normal. Judgment and thought content normal.          Assessment & Plan:  Her HTN is stable. Get fasting labs today to check lipids, etc. Set up another DEXA soon. Given a flu shot.  Alysia Penna, MD

## 2018-08-20 ENCOUNTER — Ambulatory Visit (INDEPENDENT_AMBULATORY_CARE_PROVIDER_SITE_OTHER)
Admission: RE | Admit: 2018-08-20 | Discharge: 2018-08-20 | Disposition: A | Discharge status: Home / Self Care | Payer: Medicare Other | Source: Ambulatory Visit | Attending: Family Medicine | Admitting: Family Medicine

## 2018-08-20 DIAGNOSIS — M810 Age-related osteoporosis without current pathological fracture: Secondary | ICD-10-CM

## 2018-08-24 ENCOUNTER — Other Ambulatory Visit: Payer: Self-pay | Admitting: Family Medicine

## 2018-08-28 ENCOUNTER — Other Ambulatory Visit: Payer: Self-pay | Admitting: Family Medicine

## 2018-08-28 MED ORDER — LOSARTAN POTASSIUM-HCTZ 100-25 MG PO TABS: 1.0000 | ORAL_TABLET | Freq: Every day | ORAL | 0 refills | Status: DC

## 2018-08-28 MED ORDER — POTASSIUM CHLORIDE ER 10 MEQ PO TBCR: EXTENDED_RELEASE_TABLET | ORAL | 0 refills | Status: AC

## 2018-08-28 NOTE — Telephone Encounter (Signed)
Copied from [place] [id number]. Topic: Quick Communication - Rx Refill/Question >> Aug 28, 2018  8:19 AM Scherrie Gerlach wrote: Medication: potassium chloride (K-DUR) 10 MEQ tablet losartan-hydrochlorothiazide (HYZAAR) 100-25 MG tablet Pt was seen 08/08/18 for CPE.  Pt thought meds were renewed, but pharmacy does not have  Pt needs emergency refill 10 tabs [id number] [place], [place] [phone] (Phone) [phone] (Fax)  Then 90 day each sent to [place], [place] to [facility]           [phone] (Phone) [phone] (Fax)

## 2018-08-28 NOTE — Telephone Encounter (Signed)
Copied from [place] [phone]. Topic: Quick Communication - Rx Refill/Question >> Aug 28, 2018  8:19 AM Scherrie Gerlach wrote: Medication: potassium chloride (K-DUR) 10 MEQ tablet losartan-hydrochlorothiazide (HYZAAR) 100-25 MG tablet Pt was seen 08/08/18 for CPE.  Pt thought meds were renewed, but pharmacy does not have  Pt needs emergency refill 10 tabs each sent [id number] [place], [place] [phone] (Phone) [phone] (Fax)  Then 90 day each sent to [place], [place] to [facility] [phone] (Phone) [phone] (Fax)

## 2018-08-28 NOTE — Telephone Encounter (Signed)
Please see attached request for refill to be sent to local pharmacy until mail order is received.     I called the [facility] to clarify what happened.   Pt calling in needing meds sent to local pharmacy until mail order arrives.  They received the request for the Hyzaar and K-Dur on 08/24/18.   They can't process it until 09/26/18 so both prescriptions need to be [facility] [address] for a 90 day supply 0 refills.     For the mail order [facility] is requesting you fax over a 90 day 3 refill supply of both medications next Wednesday (Nov 7) to [phone] or call [phone], option 3 to call it in.  Thank you.    PCP:  Sarajane Jews

## 2018-08-28 NOTE — Telephone Encounter (Signed)
Pt called In and state that the losartan-hydrochlorothiazide (HYZAAR) 100-25 MG tablet [[id number]] Is on back order and they have no idea when they will get it in.  She is completely out and would like to know what she needs to do with no bp meds ?    Best number [phone] Pharmacy on [address]

## 2018-08-29 NOTE — Telephone Encounter (Signed)
Patient called back was told by the Pharmacy that the medication that have replaced the Losartan is all out and the doctor need to change the Rx. Also she did not remember the name of the medication they told her. She would like something called in to the Pharmacy before the day is out so that her BP will be start elevating. Ph# [phone]

## 2018-08-29 NOTE — Telephone Encounter (Signed)
FYI - asked patient to call the pharmacy to see what alternatives are available that her insurance will cover.

## 2018-09-01 ENCOUNTER — Telehealth: Payer: Self-pay | Admitting: Family Medicine

## 2018-09-01 NOTE — Telephone Encounter (Signed)
Okay to sent new Rx?

## 2018-09-01 NOTE — Telephone Encounter (Signed)
Copied from [place] [phone] [id number]. Topic: General - Other >> Sep 01, 2018  3:02 PM Alfredia Ferguson R wrote: Pharmacy is requesting losartan-hydrochlorothiazide (HYZAAR) 100-25 MG tablet to be split so that patient can receive her meds due to combo being on backorder

## 2018-09-02 ENCOUNTER — Other Ambulatory Visit: Payer: Self-pay | Admitting: Family Medicine

## 2018-09-02 NOTE — Telephone Encounter (Signed)
Pt calling to check status on a new blood pressure being sent in to her pharmacy. She states she needs her blood pressure medication. Please advise.

## 2018-09-03 MED ORDER — HYDROCHLOROTHIAZIDE 25 MG PO TABS: 25.0000 mg | ORAL_TABLET | Freq: Every day | ORAL | 3 refills | Status: DC

## 2018-09-03 MED ORDER — LOSARTAN POTASSIUM 100 MG PO TABS: 100.0000 mg | ORAL_TABLET | Freq: Every day | ORAL | 3 refills | Status: DC

## 2018-09-03 NOTE — Telephone Encounter (Signed)
Change this to Losartan 100 mg and HCTZ 25 mg daily. Call in #90 of each with 3 rf

## 2018-09-03 NOTE — Telephone Encounter (Signed)
Rx sent and patient is aware. 

## 2018-09-18 ENCOUNTER — Other Ambulatory Visit: Payer: Self-pay | Admitting: Family Medicine

## 2018-09-29 NOTE — Telephone Encounter (Signed)
Done

## 2018-10-01 DIAGNOSIS — D485 Neoplasm of uncertain behavior of skin: Secondary | ICD-10-CM | POA: Diagnosis not present

## 2018-10-01 DIAGNOSIS — D2239 Melanocytic nevi of other parts of face: Secondary | ICD-10-CM | POA: Diagnosis not present

## 2018-10-01 DIAGNOSIS — C44311 Basal cell carcinoma of skin of nose: Secondary | ICD-10-CM | POA: Diagnosis not present

## 2018-10-02 MED ORDER — LOSARTAN POTASSIUM 100 MG PO TABS: 100.0000 mg | ORAL_TABLET | Freq: Every day | ORAL | 3 refills | Status: AC

## 2018-10-02 MED ORDER — LOSARTAN POTASSIUM-HCTZ 100-25 MG PO TABS: 1.0000 | ORAL_TABLET | Freq: Every day | ORAL | 0 refills | Status: DC

## 2018-10-02 MED ORDER — HYDROCHLOROTHIAZIDE 25 MG PO TABS: 25.0000 mg | ORAL_TABLET | Freq: Every day | ORAL | 3 refills | Status: DC

## 2018-10-02 NOTE — Addendum Note (Signed)
Addended by: Gwenyth Ober R on: 10/02/2018 04:06 PM   Modules accepted: Orders

## 2018-10-10 ENCOUNTER — Other Ambulatory Visit: Payer: Self-pay | Admitting: Nurse Practitioner

## 2018-10-10 DIAGNOSIS — M810 Age-related osteoporosis without current pathological fracture: Secondary | ICD-10-CM | POA: Insufficient documentation

## 2018-10-10 DIAGNOSIS — B001 Herpesviral vesicular dermatitis: Secondary | ICD-10-CM | POA: Insufficient documentation

## 2018-10-10 DIAGNOSIS — Z1231 Encounter for screening mammogram for malignant neoplasm of breast: Secondary | ICD-10-CM

## 2018-10-10 DIAGNOSIS — K635 Polyp of colon: Secondary | ICD-10-CM | POA: Insufficient documentation

## 2018-12-03 ENCOUNTER — Ambulatory Visit (INDEPENDENT_AMBULATORY_CARE_PROVIDER_SITE_OTHER): Payer: Medicare Other

## 2018-12-03 DIAGNOSIS — Z1231 Encounter for screening mammogram for malignant neoplasm of breast: Secondary | ICD-10-CM | POA: Diagnosis not present

## 2019-02-13 DIAGNOSIS — J309 Allergic rhinitis, unspecified: Secondary | ICD-10-CM | POA: Diagnosis not present

## 2019-02-13 DIAGNOSIS — I10 Essential (primary) hypertension: Secondary | ICD-10-CM | POA: Diagnosis not present

## 2019-02-13 DIAGNOSIS — E785 Hyperlipidemia, unspecified: Secondary | ICD-10-CM | POA: Diagnosis not present

## 2019-04-17 ENCOUNTER — Encounter: Payer: Self-pay | Admitting: Gastroenterology

## 2019-06-23 DIAGNOSIS — Z23 Encounter for immunization: Secondary | ICD-10-CM | POA: Diagnosis not present

## 2019-06-24 DIAGNOSIS — H2513 Age-related nuclear cataract, bilateral: Secondary | ICD-10-CM | POA: Diagnosis not present

## 2019-06-24 DIAGNOSIS — H35369 Drusen (degenerative) of macula, unspecified eye: Secondary | ICD-10-CM | POA: Diagnosis not present

## 2019-06-24 DIAGNOSIS — H52222 Regular astigmatism, left eye: Secondary | ICD-10-CM | POA: Diagnosis not present

## 2019-06-24 DIAGNOSIS — H524 Presbyopia: Secondary | ICD-10-CM | POA: Diagnosis not present

## 2019-06-24 DIAGNOSIS — H5203 Hypermetropia, bilateral: Secondary | ICD-10-CM | POA: Diagnosis not present

## 2019-06-24 DIAGNOSIS — H3540 Unspecified peripheral retinal degeneration: Secondary | ICD-10-CM | POA: Diagnosis not present

## 2019-07-02 DIAGNOSIS — L814 Other melanin hyperpigmentation: Secondary | ICD-10-CM | POA: Diagnosis not present

## 2019-07-02 DIAGNOSIS — L570 Actinic keratosis: Secondary | ICD-10-CM | POA: Diagnosis not present

## 2019-07-02 DIAGNOSIS — L821 Other seborrheic keratosis: Secondary | ICD-10-CM | POA: Diagnosis not present

## 2019-07-02 DIAGNOSIS — Z85828 Personal history of other malignant neoplasm of skin: Secondary | ICD-10-CM | POA: Diagnosis not present

## 2019-07-02 DIAGNOSIS — D225 Melanocytic nevi of trunk: Secondary | ICD-10-CM | POA: Diagnosis not present

## 2019-08-20 DIAGNOSIS — Z23 Encounter for immunization: Secondary | ICD-10-CM | POA: Diagnosis not present

## 2019-08-20 DIAGNOSIS — I10 Essential (primary) hypertension: Secondary | ICD-10-CM | POA: Diagnosis not present

## 2019-08-20 DIAGNOSIS — Z0000 Encounter for general adult medical examination without abnormal findings: Secondary | ICD-10-CM | POA: Diagnosis not present

## 2019-08-21 DIAGNOSIS — I10 Essential (primary) hypertension: Secondary | ICD-10-CM | POA: Diagnosis not present

## 2019-08-29 DIAGNOSIS — Z20828 Contact with and (suspected) exposure to other viral communicable diseases: Secondary | ICD-10-CM | POA: Diagnosis not present

## 2019-11-05 ENCOUNTER — Other Ambulatory Visit: Payer: Self-pay | Admitting: Family Medicine

## 2019-11-10 ENCOUNTER — Other Ambulatory Visit: Payer: Self-pay | Admitting: Family Medicine

## 2019-11-10 NOTE — Telephone Encounter (Signed)
Please advise. Lisinopril- HCTZ is on the patients list. Which medication should the patient continue?

## 2019-11-19 DIAGNOSIS — Z23 Encounter for immunization: Secondary | ICD-10-CM | POA: Diagnosis not present

## 2019-11-19 DIAGNOSIS — L570 Actinic keratosis: Secondary | ICD-10-CM | POA: Diagnosis not present

## 2019-12-06 ENCOUNTER — Ambulatory Visit: Payer: Medicare Other | Attending: Internal Medicine

## 2019-12-06 DIAGNOSIS — Z23 Encounter for immunization: Secondary | ICD-10-CM | POA: Insufficient documentation

## 2019-12-06 NOTE — Progress Notes (Signed)
   Covid-19 Vaccination Clinic  Name:  RIKKA PARAISO    MRN: [id number] DOB: May 08, 1946  12/06/2019  Ms. Dewolfe was observed post Covid-19 immunization for 15 minutes without incidence. She was provided with Vaccine Information Sheet and instruction to access the V-Safe system.   Ms. Markie was instructed to call 911 with any severe reactions post vaccine: Marland Kitchen Difficulty breathing  . Swelling of your face and throat  . A fast heartbeat  . A bad rash all over your body  . Dizziness and weakness    Immunizations Administered    Name Date Dose VIS Date Route   Pfizer COVID-19 Vaccine 12/06/2019 10:45 AM 0.3 mL 10/09/2019 Intramuscular   Manufacturer: [place]   Lot: [id number]   [place]: [id number]

## 2019-12-15 ENCOUNTER — Other Ambulatory Visit: Payer: Self-pay | Admitting: Nurse Practitioner

## 2019-12-15 DIAGNOSIS — Z1231 Encounter for screening mammogram for malignant neoplasm of breast: Secondary | ICD-10-CM

## 2019-12-24 ENCOUNTER — Other Ambulatory Visit: Payer: Self-pay

## 2019-12-24 ENCOUNTER — Ambulatory Visit: Payer: Medicare Other

## 2019-12-24 ENCOUNTER — Ambulatory Visit (INDEPENDENT_AMBULATORY_CARE_PROVIDER_SITE_OTHER): Payer: Medicare Other

## 2019-12-24 DIAGNOSIS — Z1231 Encounter for screening mammogram for malignant neoplasm of breast: Secondary | ICD-10-CM

## 2019-12-30 ENCOUNTER — Ambulatory Visit: Payer: Medicare Other | Attending: Internal Medicine

## 2019-12-30 ENCOUNTER — Ambulatory Visit: Payer: Medicare Other

## 2019-12-30 DIAGNOSIS — Z23 Encounter for immunization: Secondary | ICD-10-CM | POA: Insufficient documentation

## 2019-12-30 NOTE — Progress Notes (Signed)
   Covid-19 Vaccination Clinic  Name:  Monique [name]    MRN: [id number] DOB: [birth year]  12/30/2019  Ms. Monique [name] was observed post Covid-19 immunization for 15 minutes without incident. She was provided with Vaccine Information Sheet and instruction to access the V-Safe system.   Ms. Monique [name] was instructed to call 911 with any severe reactions post vaccine: Marland Kitchen Difficulty breathing  . Swelling of face and throat  . A fast heartbeat  . A bad rash all over body  . Dizziness and weakness   Immunizations Administered    Name Date Dose VIS Date Route   Pfizer COVID-19 Vaccine 12/30/2019  3:52 PM 0.3 mL 10/09/2019 Intramuscular   Manufacturer: [place]   Lot: [id number]   [place]: [id number]

## 2020-02-18 DIAGNOSIS — K635 Polyp of colon: Secondary | ICD-10-CM | POA: Diagnosis not present

## 2020-02-18 DIAGNOSIS — I10 Essential (primary) hypertension: Secondary | ICD-10-CM | POA: Diagnosis not present

## 2020-02-18 DIAGNOSIS — B001 Herpesviral vesicular dermatitis: Secondary | ICD-10-CM | POA: Diagnosis not present

## 2020-02-18 DIAGNOSIS — Z79899 Other long term (current) drug therapy: Secondary | ICD-10-CM | POA: Diagnosis not present

## 2020-02-18 DIAGNOSIS — M436 Torticollis: Secondary | ICD-10-CM | POA: Diagnosis not present

## 2020-02-18 DIAGNOSIS — E785 Hyperlipidemia, unspecified: Secondary | ICD-10-CM | POA: Diagnosis not present

## 2020-02-19 DIAGNOSIS — E785 Hyperlipidemia, unspecified: Secondary | ICD-10-CM | POA: Diagnosis not present

## 2020-02-19 DIAGNOSIS — Z79899 Other long term (current) drug therapy: Secondary | ICD-10-CM | POA: Diagnosis not present

## 2020-02-19 DIAGNOSIS — I10 Essential (primary) hypertension: Secondary | ICD-10-CM | POA: Diagnosis not present

## 2020-02-22 ENCOUNTER — Encounter: Payer: Self-pay | Admitting: Gastroenterology

## 2020-03-11 ENCOUNTER — Encounter: Payer: Medicare Other | Admitting: Gastroenterology

## 2020-04-15 ENCOUNTER — Encounter: Payer: Medicare Other | Admitting: Gastroenterology

## 2020-04-22 ENCOUNTER — Ambulatory Visit (AMBULATORY_SURGERY_CENTER): Payer: Self-pay | Admitting: *Deleted

## 2020-04-22 ENCOUNTER — Other Ambulatory Visit: Payer: Self-pay

## 2020-04-22 VITALS — Ht 63.0 in | Wt 132.0 lb

## 2020-04-22 DIAGNOSIS — Z86010 Personal history of colonic polyps: Secondary | ICD-10-CM

## 2020-04-22 DIAGNOSIS — Z800 Family history of malignant neoplasm of digestive organs: Secondary | ICD-10-CM

## 2020-04-22 NOTE — Progress Notes (Signed)
12-30-2019 covid vacc x 2  No egg or soy allergy known to patient  No issues with past sedation with any surgeries  or procedures, no intubation problems  No diet pills per patient No home 02 use per patient  No blood thinners per patient  Pt denies issues with constipation  No A fib or A flutter  EMMI video sent to pt's e mail  COVID 19 guidelines implemented in [place] today   Due to the COVID-19 pandemic we are asking patients to follow these guidelines. Please only bring one care partner. Please be aware that your care partner may wait in the car in the parking lot or if they feel like they will be too hot to wait in the car, they may wait in the lobby on the 4th floor. All care partners are required to wear a mask the entire time (we do not have any that we can provide them), they need to practice social distancing, and we will do a Covid check for all patient's and care partners when you arrive. Also we will check their temperature and your temperature. If the care partner waits in their car they need to stay in the parking lot the entire time and we will call them on their cell phone when the patient is ready for discharge so they can bring the car to the front of the building. Also all patient's will need to wear a mask into building.

## 2020-05-06 ENCOUNTER — Ambulatory Visit (AMBULATORY_SURGERY_CENTER): Payer: Medicare Other | Admitting: Gastroenterology

## 2020-05-06 ENCOUNTER — Encounter: Payer: Self-pay | Admitting: Gastroenterology

## 2020-05-06 ENCOUNTER — Other Ambulatory Visit: Payer: Self-pay

## 2020-05-06 VITALS — BP 125/55 | HR 52 | Temp 96.8°F | Resp 13 | Ht 63.0 in | Wt 132.0 lb

## 2020-05-06 DIAGNOSIS — D123 Benign neoplasm of transverse colon: Secondary | ICD-10-CM | POA: Diagnosis not present

## 2020-05-06 DIAGNOSIS — Z86010 Personal history of colonic polyps: Secondary | ICD-10-CM

## 2020-05-06 MED ORDER — SODIUM CHLORIDE 0.9 % IV SOLN: 500.0000 mL | INTRAVENOUS | Status: DC

## 2020-05-06 NOTE — Progress Notes (Signed)
Called to room to assist during endoscopic procedure.  Patient ID and intended procedure confirmed with present staff. Received instructions for my participation in the procedure from the performing physician.  

## 2020-05-06 NOTE — Progress Notes (Signed)
PT taken to PACU. Monitors in place. VSS. Report given to RN. 

## 2020-05-06 NOTE — Op Note (Signed)
[place] Patient Name: Monique [name] Procedure Date: 05/06/2020 10:39 AM MRN: [id number] Endoscopist: Milus Banister , MD Age: 74 Referring MD:  Date of Birth: 04/24/1946 Gender: Female Account #: [id number] Procedure:                Colonoscopy Indications:              High risk colon cancer surveillance: Personal                            history of colonic polyps; Colonoscopy 01/2018 TA at                            hepatic flexure piecemeal resected, site tattoo'd;                            Colonoscoyp 04/2018 13mm residual TA at the same                            site, removed Medicines:                Monitored Anesthesia Care Procedure:                Pre-Anesthesia Assessment:                           - Prior to the procedure, a History and Physical                            was performed, and patient medications and                            allergies were reviewed. The patient's tolerance of                            previous anesthesia was also reviewed. The risks                            and benefits of the procedure and the sedation                            options and risks were discussed with the patient.                            All questions were answered, and informed consent                            was obtained. Prior Anticoagulants: The patient has                            taken no previous anticoagulant or antiplatelet                            agents. ASA Grade Assessment: II - A patient with  mild systemic disease. After reviewing the risks                            and benefits, the patient was deemed in                            satisfactory condition to undergo the procedure.                           After obtaining informed consent, the colonoscope                            was passed under direct vision. Throughout the                            procedure, the patient's blood pressure, pulse, and                             oxygen saturations were monitored continuously. The                            Colonoscope was introduced through the anus and                            advanced to the the cecum, identified by                            appendiceal orifice and ileocecal valve. The                            colonoscopy was performed without difficulty. The                            patient tolerated the procedure well. The quality                            of the bowel preparation was good. The ileocecal                            valve, appendiceal orifice, and rectum were                            photographed. Scope In: 10:47:21 AM Scope Out: 11:00:59 AM Scope Withdrawal Time: 0 hours 9 minutes 10 seconds  Total Procedure Duration: 0 hours 13 minutes 38 seconds  Findings:                 A 4 mm polyp was found in the hepatic flexure (at                            the site of 2019 polypectomy, still evident                            tattoo). The polyp was sessile. The polyp was  removed with a cold snare. Resection and retrieval                            were complete.                           The exam was otherwise without abnormality on                            direct and retroflexion views. Complications:            No immediate complications. Estimated blood loss:                            None. Estimated Blood Loss:     Estimated blood loss: none. Impression:               - One 4 mm polyp at the hepatic flexure, removed                            with a cold snare (at the site of 2019 polypectomy,                            still evident tattoo). Resected and retrieved.                           - The examination was otherwise normal on direct                            and retroflexion views. Recommendation:           - Patient has a contact number available for                            emergencies. The signs and symptoms of  potential                            delayed complications were discussed with the                            patient. Return to normal activities tomorrow.                            Written discharge instructions were provided to the                            patient.                           - Resume previous diet.                           - Continue present medications.                           - Await pathology results. Likely recall  colonoscopy in 3 years. Milus Banister, MD 05/06/2020 11:07:13 AM This report has been signed electronically.

## 2020-05-06 NOTE — Patient Instructions (Signed)
HANDOUTS PROVIDED ON: POLYPS  The polyp removed today have been sent for pathology.  The results can take 1-3 weeks to receive.  When your next colonoscopy should occur will be based on the pathology results.    You may resume your previous diet and medication schedule.  Thank you for allowing us to care for you today!!!   YOU HAD AN ENDOSCOPIC PROCEDURE TODAY AT THE [facility]:   Refer to the procedure report that was given to you for any specific questions about what was found during the examination.  If the procedure report does not answer your questions, please call your gastroenterologist to clarify.  If you requested that your care partner not be given the details of your procedure findings, then the procedure report has been included in a sealed envelope for you to review at your convenience later.  YOU SHOULD EXPECT: Some feelings of bloating in the abdomen. Passage of more gas than usual.  Walking can help get rid of the air that was put into your GI tract during the procedure and reduce the bloating. If you had a lower endoscopy (such as a colonoscopy or flexible sigmoidoscopy) you may notice spotting of blood in your stool or on the toilet paper. If you underwent a bowel prep for your procedure, you may not have a normal bowel movement for a few days.  Please Note:  You might notice some irritation and congestion in your nose or some drainage.  This is from the oxygen used during your procedure.  There is no need for concern and it should clear up in a day or so.  SYMPTOMS TO REPORT IMMEDIATELY:   Following lower endoscopy (colonoscopy or flexible sigmoidoscopy):  Excessive amounts of blood in the stool  Significant tenderness or worsening of abdominal pains  Swelling of the abdomen that is new, acute  Fever of 100F or higher  For urgent or emergent issues, a gastroenterologist can be reached at any hour by calling [phone]. Do not use MyChart messaging for  urgent concerns.    DIET:  We do recommend a small meal at first, but then you may proceed to your regular diet.  Drink plenty of fluids but you should avoid alcoholic beverages for 24 hours.  ACTIVITY:  You should plan to take it easy for the rest of today and you should NOT DRIVE or use heavy machinery until tomorrow (because of the sedation medicines used during the test).    FOLLOW UP: Our staff will call the number listed on your records 48-72 hours following your procedure to check on you and address any questions or concerns that you may have regarding the information given to you following your procedure. If we do not reach you, we will leave a message.  We will attempt to reach you two times.  During this call, we will ask if you have developed any symptoms of COVID 19. If you develop any symptoms (ie: fever, flu-like symptoms, shortness of breath, cough etc.) before then, please call [phone].  If you test positive for Covid 19 in the 2 weeks post procedure, please call and report this information to us.    If any biopsies were taken you will be contacted by phone or by letter within the next 1-3 weeks.  Please call us at [phone] if you have not heard about the biopsies in 3 weeks.    SIGNATURES/CONFIDENTIALITY: You and/or your care partner have signed paperwork which will be entered into   your electronic medical record.  These signatures attest to the fact that that the information above on your After Visit Summary has been reviewed and is understood.  Full responsibility of the confidentiality of this discharge information lies with you and/or your care-partner.

## 2020-05-06 NOTE — Progress Notes (Signed)
Vs WR I have reviewed the patient's medical history in detail and updated the computerized patient record.

## 2020-05-10 ENCOUNTER — Telehealth: Payer: Self-pay

## 2020-05-10 NOTE — Telephone Encounter (Signed)
1st follow up call made.  NALM 

## 2020-05-10 NOTE — Telephone Encounter (Signed)
Left message on answering machine. 

## 2020-05-13 ENCOUNTER — Encounter: Payer: Self-pay | Admitting: Gastroenterology

## 2020-06-30 DIAGNOSIS — H524 Presbyopia: Secondary | ICD-10-CM | POA: Diagnosis not present

## 2020-06-30 DIAGNOSIS — H3540 Unspecified peripheral retinal degeneration: Secondary | ICD-10-CM | POA: Diagnosis not present

## 2020-06-30 DIAGNOSIS — H52222 Regular astigmatism, left eye: Secondary | ICD-10-CM | POA: Diagnosis not present

## 2020-06-30 DIAGNOSIS — H25813 Combined forms of age-related cataract, bilateral: Secondary | ICD-10-CM | POA: Diagnosis not present

## 2020-06-30 DIAGNOSIS — H219 Unspecified disorder of iris and ciliary body: Secondary | ICD-10-CM | POA: Diagnosis not present

## 2020-06-30 DIAGNOSIS — H5203 Hypermetropia, bilateral: Secondary | ICD-10-CM | POA: Diagnosis not present

## 2020-06-30 DIAGNOSIS — H04123 Dry eye syndrome of bilateral lacrimal glands: Secondary | ICD-10-CM | POA: Diagnosis not present

## 2020-07-19 DIAGNOSIS — L578 Other skin changes due to chronic exposure to nonionizing radiation: Secondary | ICD-10-CM | POA: Diagnosis not present

## 2020-07-19 DIAGNOSIS — L821 Other seborrheic keratosis: Secondary | ICD-10-CM | POA: Diagnosis not present

## 2020-07-19 DIAGNOSIS — L814 Other melanin hyperpigmentation: Secondary | ICD-10-CM | POA: Diagnosis not present

## 2020-07-19 DIAGNOSIS — L570 Actinic keratosis: Secondary | ICD-10-CM | POA: Diagnosis not present

## 2020-07-19 DIAGNOSIS — Z85828 Personal history of other malignant neoplasm of skin: Secondary | ICD-10-CM | POA: Diagnosis not present

## 2020-07-19 DIAGNOSIS — D225 Melanocytic nevi of trunk: Secondary | ICD-10-CM | POA: Diagnosis not present

## 2020-07-25 DIAGNOSIS — Z23 Encounter for immunization: Secondary | ICD-10-CM | POA: Diagnosis not present

## 2020-07-28 DIAGNOSIS — Z23 Encounter for immunization: Secondary | ICD-10-CM | POA: Diagnosis not present

## 2020-08-25 DIAGNOSIS — E2839 Other primary ovarian failure: Secondary | ICD-10-CM | POA: Diagnosis not present

## 2020-08-25 DIAGNOSIS — I10 Essential (primary) hypertension: Secondary | ICD-10-CM | POA: Diagnosis not present

## 2020-08-25 DIAGNOSIS — Z0000 Encounter for general adult medical examination without abnormal findings: Secondary | ICD-10-CM | POA: Diagnosis not present

## 2020-09-02 DIAGNOSIS — M8588 Other specified disorders of bone density and structure, other site: Secondary | ICD-10-CM | POA: Diagnosis not present

## 2020-09-02 DIAGNOSIS — M8589 Other specified disorders of bone density and structure, multiple sites: Secondary | ICD-10-CM | POA: Diagnosis not present

## 2020-09-02 DIAGNOSIS — M85851 Other specified disorders of bone density and structure, right thigh: Secondary | ICD-10-CM | POA: Diagnosis not present

## 2020-10-29 ENCOUNTER — Other Ambulatory Visit: Payer: Self-pay | Admitting: Family Medicine

## 2020-11-16 ENCOUNTER — Other Ambulatory Visit: Payer: Self-pay | Admitting: Family Medicine

## 2020-11-16 NOTE — Telephone Encounter (Signed)
DENIED.  PT IS SEEN AT [facility].

## 2020-11-30 ENCOUNTER — Other Ambulatory Visit: Payer: Self-pay | Admitting: Nurse Practitioner

## 2020-11-30 DIAGNOSIS — Z1231 Encounter for screening mammogram for malignant neoplasm of breast: Secondary | ICD-10-CM

## 2021-01-05 ENCOUNTER — Ambulatory Visit: Payer: Medicare Other

## 2021-01-19 ENCOUNTER — Other Ambulatory Visit: Payer: Self-pay

## 2021-01-19 ENCOUNTER — Ambulatory Visit (INDEPENDENT_AMBULATORY_CARE_PROVIDER_SITE_OTHER): Payer: Medicare Other

## 2021-01-19 DIAGNOSIS — Z1231 Encounter for screening mammogram for malignant neoplasm of breast: Secondary | ICD-10-CM | POA: Diagnosis not present

## 2021-12-18 ENCOUNTER — Other Ambulatory Visit: Payer: Self-pay | Admitting: Registered Nurse

## 2021-12-18 DIAGNOSIS — Z1231 Encounter for screening mammogram for malignant neoplasm of breast: Secondary | ICD-10-CM

## 2022-01-25 ENCOUNTER — Ambulatory Visit: Payer: Medicare Other

## 2022-01-31 ENCOUNTER — Encounter: Payer: Self-pay | Admitting: Gastroenterology

## 2022-01-31 ENCOUNTER — Other Ambulatory Visit (INDEPENDENT_AMBULATORY_CARE_PROVIDER_SITE_OTHER): Payer: Medicare Other

## 2022-01-31 ENCOUNTER — Ambulatory Visit (INDEPENDENT_AMBULATORY_CARE_PROVIDER_SITE_OTHER): Payer: Medicare Other | Admitting: Gastroenterology

## 2022-01-31 DIAGNOSIS — A048 Other specified bacterial intestinal infections: Secondary | ICD-10-CM | POA: Diagnosis not present

## 2022-01-31 DIAGNOSIS — R194 Change in bowel habit: Secondary | ICD-10-CM

## 2022-01-31 LAB — COMPREHENSIVE METABOLIC PANEL WITH GFR
ALT: 15 U/L (ref 0–35)
AST: 24 U/L (ref 0–37)
Albumin: 4.2 g/dL (ref 3.5–5.2)
Alkaline Phosphatase: 63 U/L (ref 39–117)
BUN: 11 mg/dL (ref 6–23)
CO2: 28 meq/L (ref 19–32)
Calcium: 9.3 mg/dL (ref 8.4–10.5)
Chloride: 95 meq/L — ABNORMAL LOW (ref 96–112)
Creatinine, Ser: 0.69 mg/dL (ref 0.40–1.20)
GFR: 85.97 mL/min
Glucose, Bld: 133 mg/dL — ABNORMAL HIGH (ref 70–99)
Potassium: 4 meq/L (ref 3.5–5.1)
Sodium: 131 meq/L — ABNORMAL LOW (ref 135–145)
Total Bilirubin: 0.5 mg/dL (ref 0.2–1.2)
Total Protein: 6.8 g/dL (ref 6.0–8.3)

## 2022-01-31 LAB — C-REACTIVE PROTEIN: CRP: <1 mg/dL (ref 0.5–20.0)

## 2022-01-31 LAB — CBC
HCT: 38.2 % (ref 36.0–46.0)
Hemoglobin: 13.3 g/dL (ref 12.0–15.0)
MCHC: 34.8 g/dL (ref 30.0–36.0)
MCV: 98.4 fl (ref 78.0–100.0)
Platelets: 316 10*3/uL (ref 150.0–400.0)
RBC: 3.88 Mil/uL (ref 3.87–5.11)
RDW: 12.3 % (ref 11.5–15.5)
WBC: 6.5 10*3/uL (ref 4.0–10.5)

## 2022-01-31 LAB — SEDIMENTATION RATE: Sed Rate: 12 mm/h (ref 0–30)

## 2022-01-31 LAB — TSH: TSH: 1.74 u[IU]/mL (ref 0.35–5.50)

## 2022-01-31 MED ORDER — LOPERAMIDE HCL 2 MG PO TABS: 2.0000 mg | ORAL_TABLET | Freq: Every morning | ORAL | 0 refills | Status: AC

## 2022-01-31 NOTE — Patient Instructions (Signed)
If you are age 74 or younger, your body mass index should be between 19-25. Your Body mass index is 23.47 kg/m?Marland Kitchen If this is out of the aformentioned range listed, please consider follow up with your Primary Care Provider.  ?________________________________________________________ ? ?The [facility] providers would like to encourage you to use [facility] to communicate with providers for non-urgent requests or questions.  Due to long hold times on the telephone, sending your provider a message by [facility] may be a faster and more efficient way to get a response.  Please allow 48 business hours for a response.  Please remember that this is for non-urgent requests.  ?_______________________________________________________ ? ?Your provider has requested that you go to the basement level for lab work before leaving today. Press "B" on the elevator. The lab is located at the first door on the left as you exit the elevator. ? ?Due to recent changes in healthcare laws, you may see the results of your imaging and laboratory studies on MyChart before your provider has had a chance to review them.  We understand that in some cases there may be results that are confusing or concerning to you. Not all laboratory results come back in the same time frame and the provider may be waiting for multiple results in order to interpret others.  Please give [place] 48 hours in order for your provider to thoroughly review all the results before contacting the office for clarification of your results.  ? ?Please purchase the following medications over the counter and take as directed: ? ?START: Imodium one tablet every morning. ? ?Thank you for entrusting me with your care and choosing [facility]. ? ?Dr Ardis Hughs ? ?

## 2022-01-31 NOTE — Progress Notes (Signed)
Review of pertinent gastrointestinal problems: ?1.  Family history colon cancer, mother in her 21s and also history of adenomatous colon polyps colonoscopy 11/2012 single subcentimeter adenoma removed.  Colonoscopy 01/2018 TA at hepatic flexure piecemeal resected, site tattoo'd; Colonoscopy 04/2018 43m residual TA at the same site, removed.  Colonoscopy 04/2020 4 mm of residual/recurrent tubular adenoma at the same site in the hepatic flexure.  Recall colonoscopy recommended 3 year interval. ? ?HPI: ?This is a very pleasant 76year old woman whom I last saw a little less than 2 years ago the time of colonoscopy.  See the results summarized above. ? ?[place] has had loose stools.  Prior to that she would move her bowels about once a day soft well formed.  Since that trip and during the trip she has been having loose stools twice daily with very mild urgency.  There is slightly strange color as well.  Around the same time she has noticed a very stiff neck. ? ?She has not had any bleeding, no abdominal pains.  She was not on any antibiotics recently.  She does not have nocturnal symptoms. ? ?Her weight is overall stable ? ?Magnesium is on her med list but she is very clear that she does not take magnesium at all.  We will remove from her medicine list. ? ? ?ROS: complete GI ROS as described in HPI, all other review negative. ? ?Constitutional:  No unintentional weight loss ? ? ?Past Medical History:  ?Diagnosis Date  ? Allergy   ? mild Runny nose only   ? Hyperlipidemia   ? Hypertension   ? Osteopenia diagnosed 2009  ? last DEXA July 2014   ? ? ?Past Surgical History:  ?Procedure Laterality Date  ? COLONOSCOPY  05/28/2018  ? Dr. JArdis Hughs adenomatous polyp, repeat in 1 year   ? POLYPECTOMY    ? Tooth implant    ? TUBAL LIGATION    ? WISDOM TOOTH EXTRACTION    ? ? ?Current Outpatient Medications  ?Medication Instructions  ? Calcium-Vitamin D-Vitamin K (CALCIUM SOFT CHEWS PO)  Oral, Daily  ? diclofenac (VOLTAREN) 75 MG EC tablet TAKE 1 TABLET BY MOUTH TWICE A DAY  ? hydrochlorothiazide (HYDRODIURIL) 25 MG tablet TAKE 1 TABLET BY MOUTH EVERY DAY  ? losartan (COZAAR) 100 mg, Oral, Daily  ? Magnesium 250 MG TABS Oral  ? metoprolol succinate (TOPROL-XL) 50 MG 24 hr tablet TAKE 1 TABLET DAILY WITH ORIMMEDIATELY FOLLOWING A    MEAL  ? Multiple Vitamin (MULTIVITAMIN) tablet 1 tablet, Oral, Daily  ? olmesartan (BENICAR) 40 mg, Oral, Daily  ? potassium chloride (K-DUR) 10 MEQ tablet TAKE 1 TABLET DAILY  ? valACYclovir (VALTREX) 500 mg, Oral, As needed  ? ? ?Allergies as of 01/31/2022  ? (No Known Allergies)  ? ? ?Family History  ?Problem Relation Age of Onset  ? Colon cancer Mother   ?     dx'd in her 615's ? Lung cancer Father   ? Prostate cancer Father   ? Cancer Other   ?     breast, lung, colon, prostate  ? Hypertension Other   ? Breast cancer Maternal Aunt   ? Esophageal cancer Neg Hx   ? Liver cancer Neg Hx   ? Pancreatic cancer Neg Hx   ? Stomach cancer Neg Hx   ? Rectal cancer Neg Hx   ? Colon polyps Neg Hx   ? ? ?Social History  ? ?Socioeconomic History  ?  Marital status: Married  ?  Spouse name: Not on file  ? Number of children: Not on file  ? Years of education: Not on file  ? Highest education level: Not on file  ?Occupational History  ? Not on file  ?Tobacco Use  ? Smoking status: Never  ? Smokeless tobacco: Never  ?Substance and Sexual Activity  ? Alcohol use: Yes  ?  Alcohol/week: 7.0 standard drinks  ?  Types: 7 Standard drinks or equivalent per week  ?  Comment: glass of wine each day  ? Drug use: No  ? Sexual activity: Not on file  ?Other Topics Concern  ? Not on file  ?Social History Narrative  ? Not on file  ? ?Social Determinants of Health  ? ?Financial Resource Strain: Not on file  ?Food Insecurity: Not on file  ?Transportation Needs: Not on file  ?Physical Activity: Not on file  ?Stress: Not on file  ?Social Connections: Not on file  ?Intimate Partner Violence: Not on file   ? ? ? ?Physical Exam: ?BP 132/70   Pulse 60   Ht '5\' 3"'$  (1.6 m)   Wt 132 lb 8 oz (60.1 kg)   SpO2 97%   BMI 23.47 kg/m?  ?Constitutional: generally well-appearing ?Psychiatric: alert and oriented x3 ?Abdomen: soft, nontender, nondistended, no obvious ascites, no peritoneal signs, normal bowel sounds ?No peripheral edema noted in lower extremities ? ?Assessment and plan: ?76 y.o. female with change in bowel habits, loose stools ? ?Unclear etiology.  Her mother had colon cancer and she has had precancerous colon polyps.  I am going to do a basic work-up with stool testing and blood work including CBC, complete metabolic profile, thyroid testing inflammatory markers and celiac serologies.  She will start taking a single over-the-counter Imodium once daily for now. ? ?If the above work-up is unrevealing and she is not significantly improved on a single over-the-counter Imodium that I think it would like to proceed with colonoscopy given her family history of colon cancer and personal history of precancerous colon polyps. ? ?Please see the "Patient Instructions" section for addition details about the plan. ? ?Owens Loffler, MD ?[facility] ?01/31/2022, 3:29 PM ? ? ?Total time on date of encounter was 25 minutes (this included time spent preparing to see the patient reviewing records; obtaining and/or reviewing separately obtained history; performing a medically appropriate exam and/or evaluation; counseling and educating the patient and family if present; ordering medications, tests or procedures if applicable; and documenting clinical information in the health record). ? ? ? ? ?

## 2022-02-01 ENCOUNTER — Other Ambulatory Visit: Payer: Medicare Other

## 2022-02-01 DIAGNOSIS — A048 Other specified bacterial intestinal infections: Secondary | ICD-10-CM

## 2022-02-01 DIAGNOSIS — R194 Change in bowel habit: Secondary | ICD-10-CM

## 2022-02-01 LAB — TISSUE TRANSGLUTAMINASE, IGA: (tTG) Ab, IgA: <1 U/mL

## 2022-02-01 LAB — IGA: Immunoglobulin A: 147 mg/dL (ref 70–320)

## 2022-02-03 LAB — GI PROFILE, STOOL, PCR
Adenovirus F 40/41: NOT DETECTED
Astrovirus: NOT DETECTED
C difficile toxin A/B: NOT DETECTED
Campylobacter: DETECTED — AB
Cryptosporidium: NOT DETECTED
Cyclospora cayetanensis: NOT DETECTED
Entamoeba histolytica: NOT DETECTED
Enteroaggregative E coli: NOT DETECTED
Enteropathogenic E coli: DETECTED — AB
Enterotoxigenic E coli: NOT DETECTED
Giardia lamblia: NOT DETECTED
Norovirus GI/GII: NOT DETECTED
Plesiomonas shigelloides: NOT DETECTED
Rotavirus A: NOT DETECTED
Salmonella: NOT DETECTED
Sapovirus: NOT DETECTED
Shiga-toxin-producing E coli: NOT DETECTED
Shigella/Enteroinvasive E coli: NOT DETECTED
Vibrio cholerae: NOT DETECTED
Vibrio: NOT DETECTED
Yersinia enterocolitica: NOT DETECTED

## 2022-02-05 ENCOUNTER — Other Ambulatory Visit: Payer: Self-pay

## 2022-02-05 MED ORDER — CITRUCEL PO POWD: 1.0000 | Freq: Every day | ORAL | Status: AC

## 2022-02-05 MED ORDER — AZITHROMYCIN 500 MG PO TABS
500.0000 mg | ORAL_TABLET | Freq: Every day | ORAL | 0 refills | Status: AC
Start: 2022-02-05 — End: 2022-02-08

## 2022-02-05 NOTE — Progress Notes (Signed)
Patient notified of lab results per Dr Ardis Hughs.  Azithromycin '500mg'$  daily for 3 days sent to pharmacy.  Patient advised to start fiber supplement (citrucel) as she did not start at last office visit. She will return call to our office in 4 weeks to report on how she is responding.  Patient agreed to plan and verbalized understanding.  No further questions.  ?

## 2022-02-15 ENCOUNTER — Ambulatory Visit (INDEPENDENT_AMBULATORY_CARE_PROVIDER_SITE_OTHER): Payer: Medicare Other

## 2022-02-15 DIAGNOSIS — Z1231 Encounter for screening mammogram for malignant neoplasm of breast: Secondary | ICD-10-CM | POA: Diagnosis not present

## 2022-05-27 IMAGING — MG MM DIGITAL SCREENING BILAT W/ TOMO AND CAD
6 of 10 series · 6 of 30 positions shown · non-contrast
Comparison: Previous exam(s).

CLINICAL DATA: Screening.

EXAM:
DIGITAL SCREENING BILATERAL MAMMOGRAM WITH TOMOSYNTHESIS AND CAD
TECHNIQUE: Bilateral screening digital craniocaudal and mediolateral oblique
mammograms were obtained. Bilateral screening digital breast
tomosynthesis was performed. The images were evaluated with
computer-aided detection.

[L CC synth-2D]
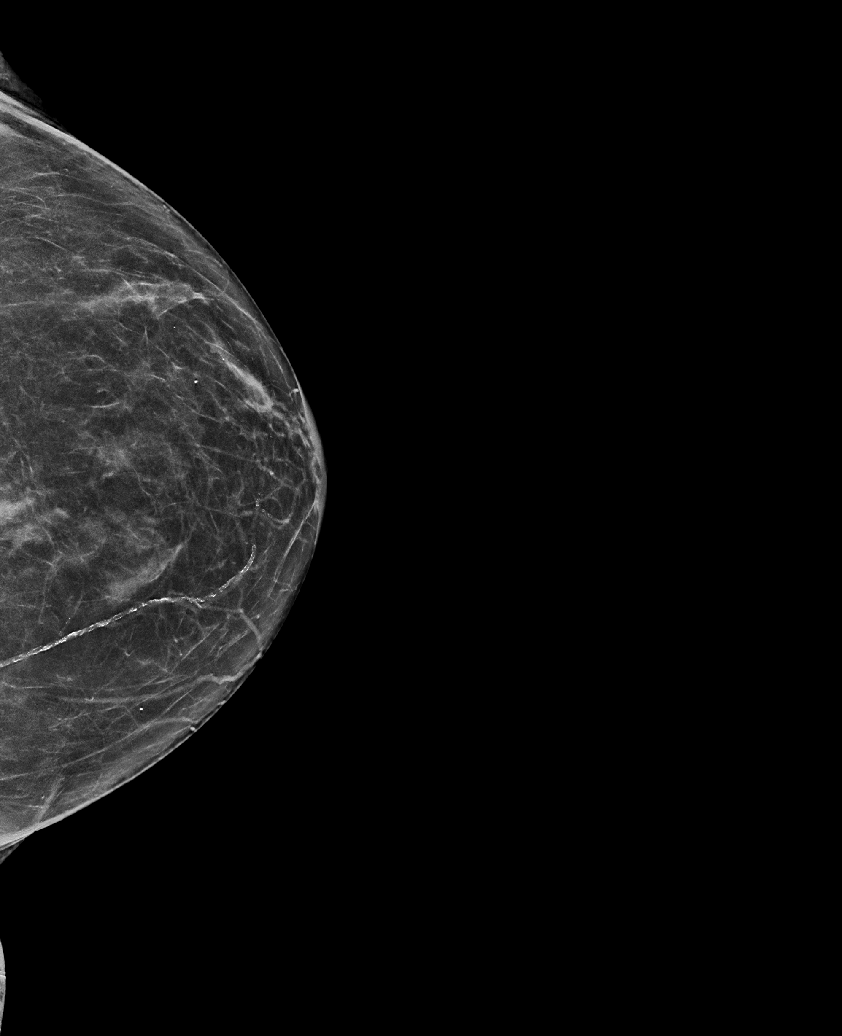

[L MLO synth-2D]
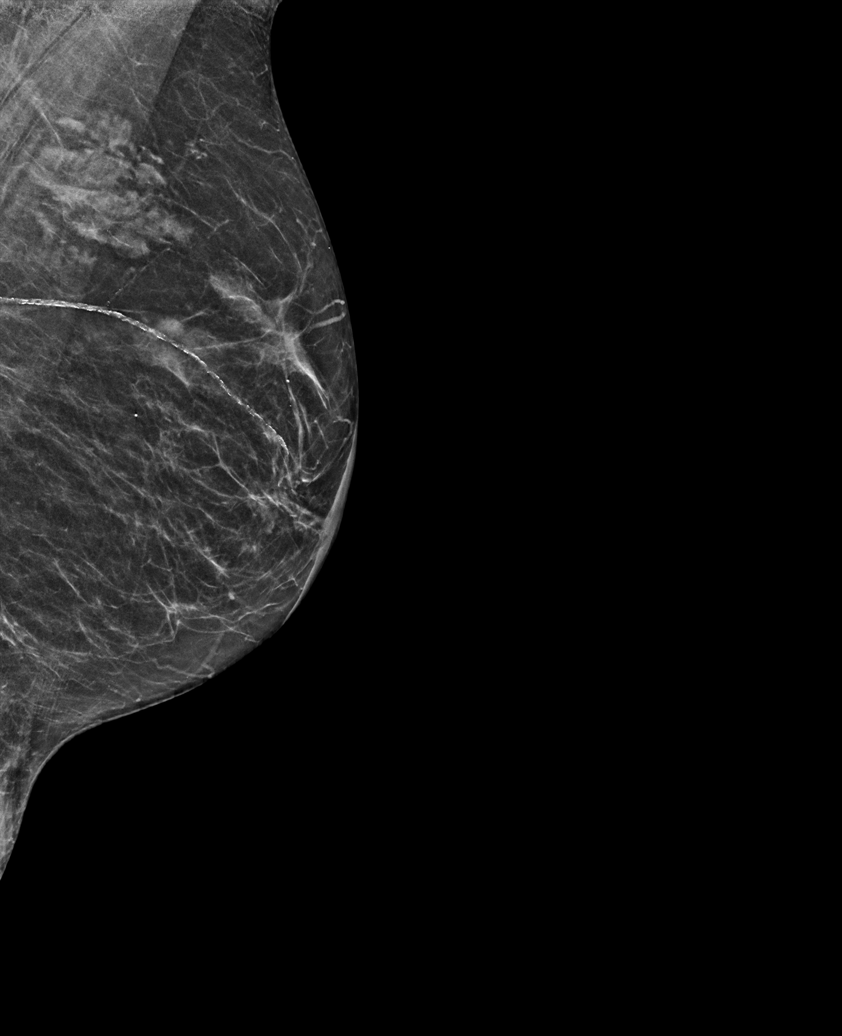

[R XCCL synth-2D]
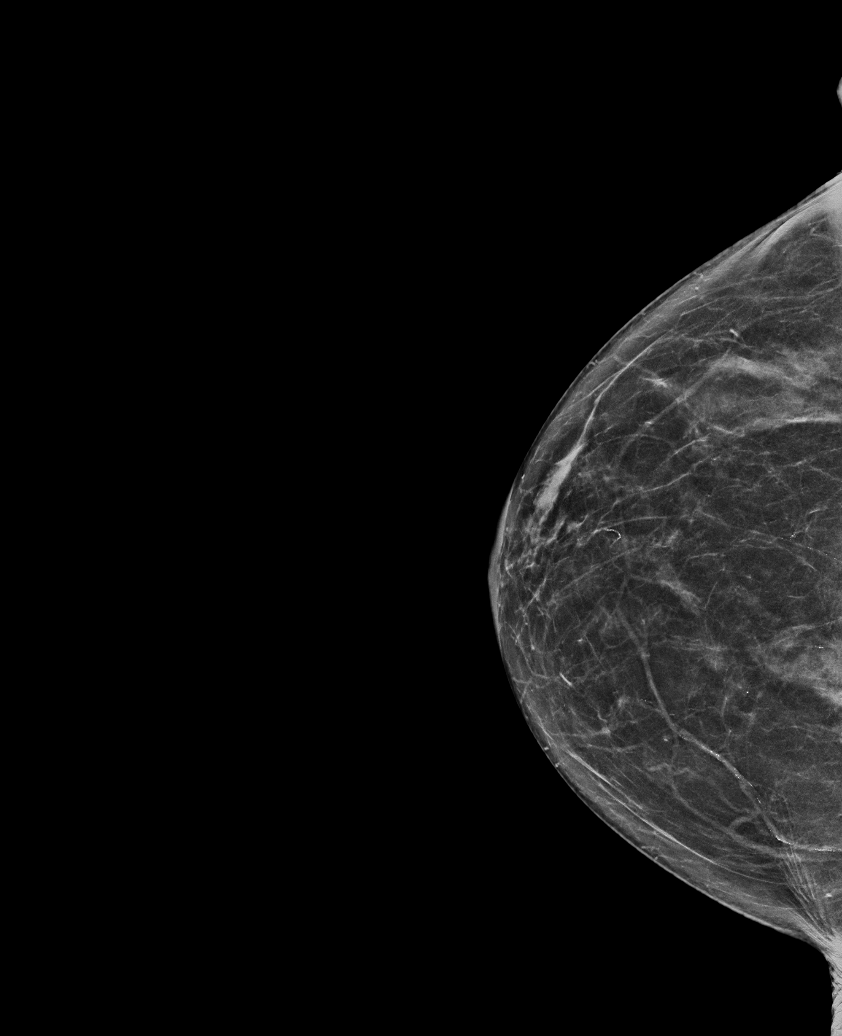

[R MLO synth-2D]
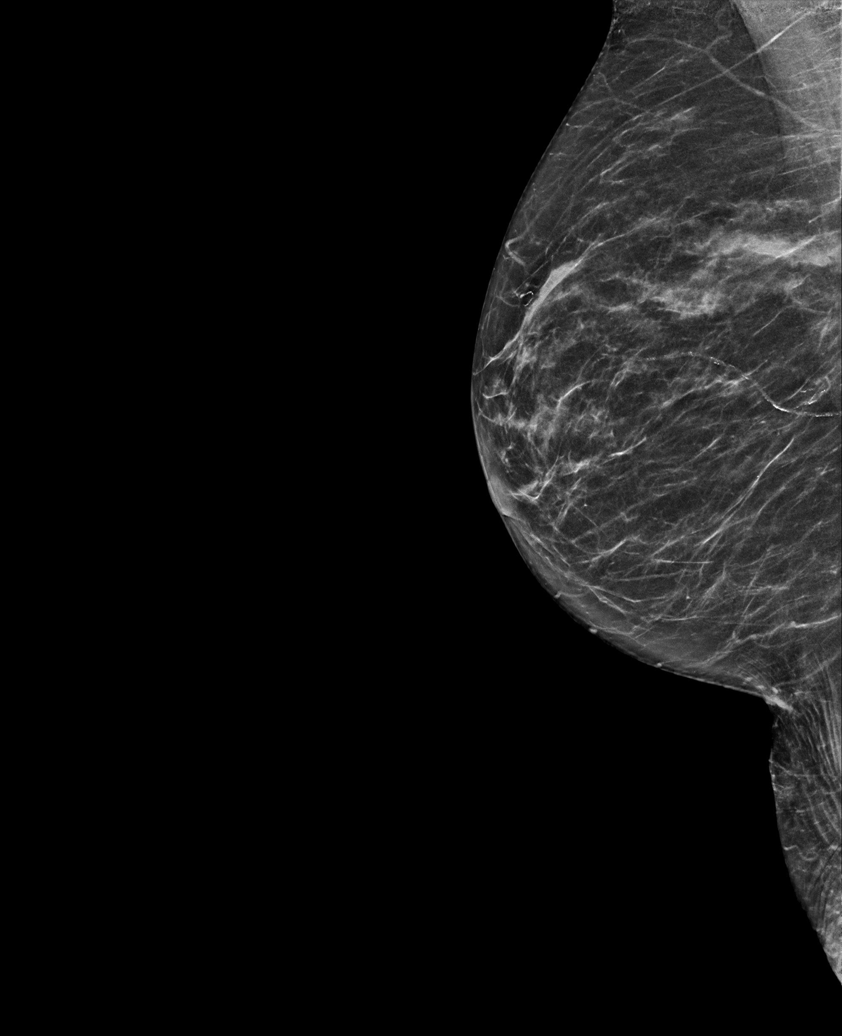

[R CC synth-2D]
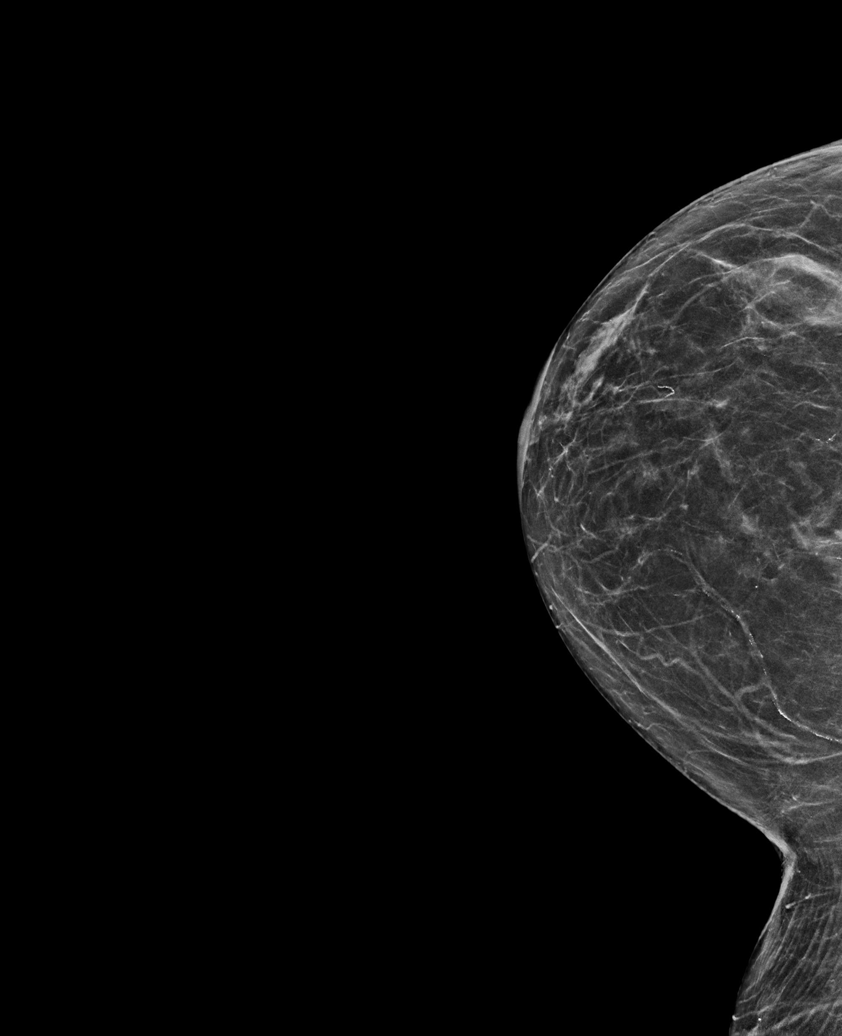

[R MLO tomo · tomo slice 29/58.0]
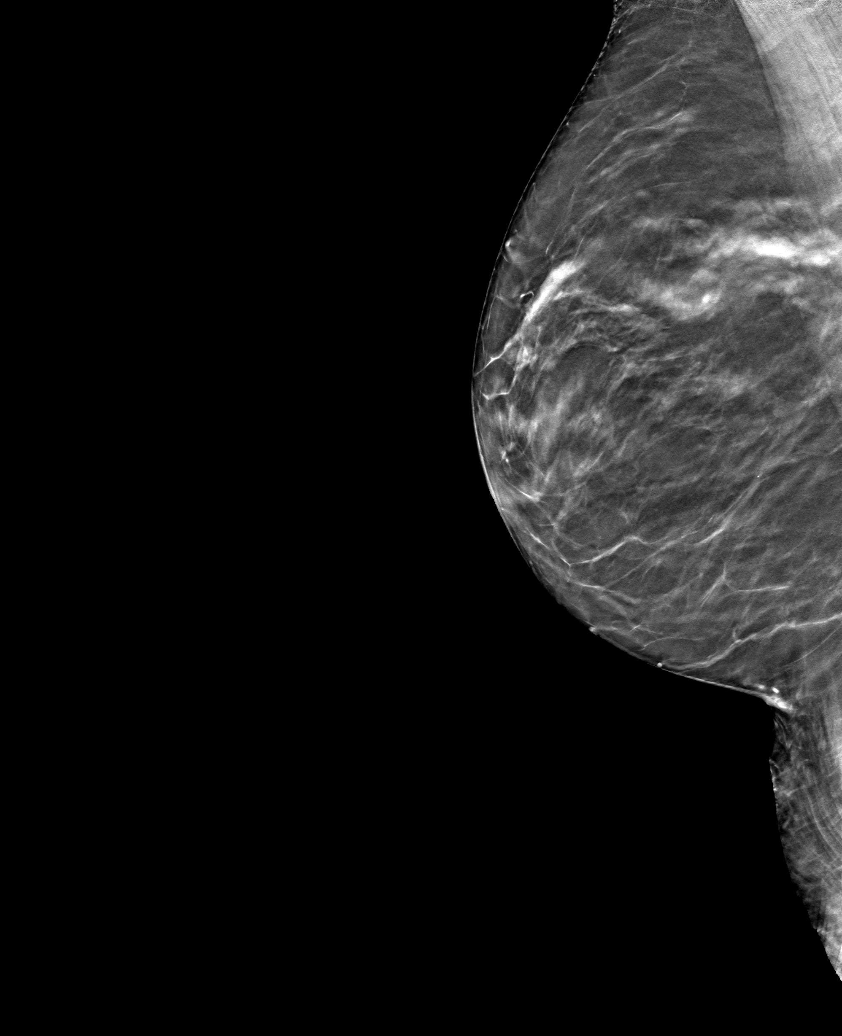

[6 of 30 positions shown; findings below may reference images not displayed]

ACR Breast Density Category b: There are scattered areas of
fibroglandular density.
FINDINGS: There are no findings suspicious for malignancy.
IMPRESSION: No mammographic evidence of malignancy. A result letter of this
screening mammogram will be mailed directly to the patient.

RECOMMENDATION:
Screening mammogram in one year. (Code:51-O-LD2)

BI-RADS CATEGORY  1: Negative.

## 2022-07-06 ENCOUNTER — Other Ambulatory Visit: Payer: Self-pay

## 2022-07-06 ENCOUNTER — Encounter (HOSPITAL_COMMUNITY): Payer: Self-pay | Admitting: Orthopedic Surgery

## 2022-07-06 NOTE — H&P (Cosign Needed)
Monique [name] is an 76 y.o. female.   Chief Complaint: LEFT HAND PAIN  HPI: The patient is a 76 year old left-hand dominant female who fell on 07/03/22 landing on the left hand.  She has had weakness, swelling, ecchymosis, stiffness, and pain.  She has been placed in an ulnar gutter splint and has been keeping it clean and dry.  She was seen in our office and we discussed the reason and rationale for surgical intervention. She is here today for surgery. She denies chest pain, shortness of breath, fever, chills, nausea, vomiting, diarrhea.   Past Medical History:  Diagnosis Date   Allergy    mild Runny nose only    Complication of anesthesia    Hyperlipidemia    Hypertension    Osteopenia diagnosed 2009   last DEXA July 2014     Past Surgical History:  Procedure Laterality Date   COLONOSCOPY  05/28/2018   Dr. Ardis Hughs, adenomatous polyp, repeat in 1 year    POLYPECTOMY     Tooth implant     TUBAL LIGATION     WISDOM TOOTH EXTRACTION      Family History  Problem Relation Age of Onset   Colon cancer Mother        dx'd in her 74's   Lung cancer Father    Prostate cancer Father    Cancer Other        breast, lung, colon, prostate   Hypertension Other    Breast cancer Maternal Aunt    Esophageal cancer Neg Hx    Liver cancer Neg Hx    Pancreatic cancer Neg Hx    Stomach cancer Neg Hx    Rectal cancer Neg Hx    Colon polyps Neg Hx    Social History:  reports that she has never smoked. She has never used smokeless tobacco. She reports current alcohol use of about 7.0 standard drinks of alcohol per week. She reports that she does not use drugs.  Allergies: No Known Allergies  No medications prior to admission.    No results found for this or any previous visit (from the past 48 hour(s)). No results found.  ROS NO RECENT ILLNESSES OR HOSPITALIZATIONS  There were no vitals taken for this visit. Physical Exam  General Appearance:  Alert, cooperative, no distress, appears  stated age  Head:  Normocephalic, without obvious abnormality, atraumatic  Eyes:  Pupils equal, conjunctiva/corneas clear,         Throat: Lips, mucosa, and tongue normal; teeth and gums normal  Neck: No visible masses     Lungs:   respirations unlabored  Chest Wall:  No tenderness or deformity  Heart:  Regular rate and rhythm,  Abdomen:   Soft, non-tender,         Extremities: lEFT HAND: SKIN INTACT, FINGERS WARM WELL PERFUSED LIMITED SMALL FINGER FLEXION  Pulses: 2+ and symmetric  Skin: Skin color, texture, turgor normal, no rashes or lesions     Neurologic: Normal     Assessment/Plan LEFT SMALL FINGER DISPLACED METACARPAL NECK FRACTURE      - LEFT SMALL FINGER OPEN REDUCTION AND INTERNAL FIXATION WITH REPAIR AS INDICATED  R/B/A DISCUSSED WITH PT IN OFFICE.  PT VOICED UNDERSTANDING OF PLAN CONSENT SIGNED DAY OF SURGERY PT SEEN AND EXAMINED PRIOR TO OPERATIVE PROCEDURE/DAY OF SURGERY SITE MARKED. QUESTIONS ANSWERED WILL GO HOME FOLLOWING SURGERY    WE ARE PLANNING SURGERY FOR YOUR UPPER EXTREMITY. THE RISKS AND BENEFITS OF SURGERY INCLUDE BUT NOT LIMITED TO  BLEEDING INFECTION, DAMAGE TO NEARBY NERVES ARTERIES TENDONS, FAILURE OF SURGERY TO ACCOMPLISH ITS INTENDED GOALS, PERSISTENT SYMPTOMS AND NEED FOR FURTHER SURGICAL INTERVENTION. WITH THIS IN MIND WE WILL PROCEED. I HAVE DISCUSSED WITH THE PATIENT THE PRE AND POSTOPERATIVE REGIMEN AND THE DOS AND DON'TS. PT VOICED UNDERSTANDING AND INFORMED CONSENT SIGNED.   Clare Casto Caralyn Guile MD  Brynda Peon 07/06/2022, 4:58 PM

## 2022-07-06 NOTE — Progress Notes (Addendum)
Spoke with patient with presurgical instructions, her only medical history includes hypertension and hyperlipidemia. I told her she can take metoprolol and valtrex (PRN) in the morning along with clear beverages until 0430. Shower instructions given. Patient indicated she does not take voltaren and was informed to not take her multivitamin or calcium chewable.

## 2022-07-07 ENCOUNTER — Encounter (HOSPITAL_COMMUNITY)
Admission: RE | Disposition: A | Discharge status: Home / Self Care | Payer: Self-pay | Source: Home / Self Care | Attending: Orthopedic Surgery

## 2022-07-07 ENCOUNTER — Ambulatory Visit (HOSPITAL_COMMUNITY)
Admission: RE | Admit: 2022-07-07 | Discharge: 2022-07-07 | Disposition: A | Discharge status: Home / Self Care | Payer: Medicare Other | Attending: Orthopedic Surgery | Admitting: Orthopedic Surgery

## 2022-07-07 ENCOUNTER — Ambulatory Visit (HOSPITAL_COMMUNITY): Payer: Medicare Other | Admitting: Anesthesiology

## 2022-07-07 ENCOUNTER — Ambulatory Visit (HOSPITAL_BASED_OUTPATIENT_CLINIC_OR_DEPARTMENT_OTHER): Payer: Medicare Other | Admitting: Anesthesiology

## 2022-07-07 ENCOUNTER — Other Ambulatory Visit: Payer: Self-pay

## 2022-07-07 ENCOUNTER — Encounter (HOSPITAL_COMMUNITY): Payer: Self-pay | Admitting: Orthopedic Surgery

## 2022-07-07 ENCOUNTER — Ambulatory Visit (HOSPITAL_COMMUNITY): Payer: Medicare Other

## 2022-07-07 DIAGNOSIS — S62327A Displaced fracture of shaft of fifth metacarpal bone, left hand, initial encounter for closed fracture: Secondary | ICD-10-CM

## 2022-07-07 DIAGNOSIS — I10 Essential (primary) hypertension: Secondary | ICD-10-CM | POA: Insufficient documentation

## 2022-07-07 DIAGNOSIS — S62337A Displaced fracture of neck of fifth metacarpal bone, left hand, initial encounter for closed fracture: Secondary | ICD-10-CM

## 2022-07-07 DIAGNOSIS — W19XXXA Unspecified fall, initial encounter: Secondary | ICD-10-CM | POA: Insufficient documentation

## 2022-07-07 HISTORY — PX: OPEN REDUCTION INTERNAL FIXATION (ORIF) METACARPAL: SHX6234

## 2022-07-07 HISTORY — DX: Other complications of anesthesia, initial encounter: T88.59XA

## 2022-07-07 LAB — BASIC METABOLIC PANEL
Anion gap: 13 (ref 5–15)
BUN: 10 mg/dL (ref 8–23)
CO2: 22 mmol/L (ref 22–32)
Calcium: 9 mg/dL (ref 8.9–10.3)
Chloride: 92 mmol/L — ABNORMAL LOW (ref 98–111)
Creatinine, Ser: 0.65 mg/dL (ref 0.44–1.00)
GFR, Estimated: >60 mL/min (ref >60)
Glucose, Bld: 95 mg/dL (ref 70–99)
Potassium: 3.6 mmol/L (ref 3.5–5.1)
Sodium: 127 mmol/L — ABNORMAL LOW (ref 135–145)

## 2022-07-07 SURGERY — OPEN REDUCTION INTERNAL FIXATION (ORIF) METACARPAL
Anesthesia: Regional | Site: Finger | Laterality: Left

## 2022-07-07 MED ORDER — KETOROLAC TROMETHAMINE 15 MG/ML IJ SOLN: 15.0000 mg | Freq: Once | INTRAMUSCULAR | Status: DC | PRN

## 2022-07-07 MED ORDER — ROPIVACAINE HCL 5 MG/ML IJ SOLN
INTRAMUSCULAR | Status: DC | PRN
  Administered 2022-07-07: 30 mL via PERINEURAL

## 2022-07-07 MED ORDER — ORAL CARE MOUTH RINSE: 15.0000 mL | Freq: Once | OROMUCOSAL | Status: AC

## 2022-07-07 MED ORDER — FENTANYL CITRATE (PF) 100 MCG/2ML IJ SOLN: 25.0000 ug | INTRAMUSCULAR | Status: DC | PRN

## 2022-07-07 MED ORDER — MIDAZOLAM HCL 2 MG/2ML IJ SOLN
INTRAMUSCULAR | Status: AC
  Administered 2022-07-07: 1 mg via INTRAVENOUS
  Filled 2022-07-07: qty 2

## 2022-07-07 MED ORDER — HYDROCODONE-ACETAMINOPHEN 5-325 MG PO TABS: 1.0000 | ORAL_TABLET | ORAL | 0 refills | Status: AC | PRN

## 2022-07-07 MED ORDER — CHLORHEXIDINE GLUCONATE 0.12 % MT SOLN
15.0000 mL | Freq: Once | OROMUCOSAL | Status: AC
  Administered 2022-07-07: 15 mL via OROMUCOSAL
  Filled 2022-07-07: qty 15

## 2022-07-07 MED ORDER — FENTANYL CITRATE (PF) 100 MCG/2ML IJ SOLN: 50.0000 ug | Freq: Once | INTRAMUSCULAR | Status: AC

## 2022-07-07 MED ORDER — 0.9 % SODIUM CHLORIDE (POUR BTL) OPTIME
TOPICAL | Status: DC | PRN
  Administered 2022-07-07: 1000 mL

## 2022-07-07 MED ORDER — PROPOFOL 500 MG/50ML IV EMUL
INTRAVENOUS | Status: DC | PRN
  Administered 2022-07-07: 75 ug/kg/min via INTRAVENOUS

## 2022-07-07 MED ORDER — ACETAMINOPHEN 10 MG/ML IV SOLN: 1000.0000 mg | Freq: Once | INTRAVENOUS | Status: DC | PRN

## 2022-07-07 MED ORDER — ONDANSETRON HCL 4 MG/2ML IJ SOLN: 4.0000 mg | Freq: Once | INTRAMUSCULAR | Status: DC | PRN

## 2022-07-07 MED ORDER — FENTANYL CITRATE (PF) 100 MCG/2ML IJ SOLN
INTRAMUSCULAR | Status: AC
  Administered 2022-07-07: 50 ug via INTRAVENOUS
  Filled 2022-07-07: qty 2

## 2022-07-07 MED ORDER — CEFAZOLIN SODIUM-DEXTROSE 2-4 GM/100ML-% IV SOLN
2.0000 g | INTRAVENOUS | Status: AC
  Administered 2022-07-07: 2 g via INTRAVENOUS
  Filled 2022-07-07: qty 100

## 2022-07-07 MED ORDER — AMISULPRIDE (ANTIEMETIC) 5 MG/2ML IV SOLN: 10.0000 mg | Freq: Once | INTRAVENOUS | Status: DC | PRN

## 2022-07-07 MED ORDER — LACTATED RINGERS IV SOLN: INTRAVENOUS | Status: DC

## 2022-07-07 MED ORDER — MIDAZOLAM HCL 2 MG/2ML IJ SOLN: 1.0000 mg | Freq: Once | INTRAMUSCULAR | Status: AC

## 2022-07-07 SURGICAL SUPPLY — 47 items
BAG COUNTER SPONGE SURGICOUNT (BAG) ×1 IMPLANT
BAG SPNG CNTER NS LX DISP (BAG) ×1
BIT DRILL CANN SMOOTH 2.4X110 (DRILL) IMPLANT
BNDG CMPR 9X4 STRL LF SNTH (GAUZE/BANDAGES/DRESSINGS) ×1
BNDG ELASTIC 3X5.8 VLCR STR LF (GAUZE/BANDAGES/DRESSINGS) ×1 IMPLANT
BNDG ELASTIC 4X5.8 VLCR STR LF (GAUZE/BANDAGES/DRESSINGS) ×1 IMPLANT
BNDG ESMARK 4X9 LF (GAUZE/BANDAGES/DRESSINGS) ×1 IMPLANT
BNDG GAUZE DERMACEA FLUFF 4 (GAUZE/BANDAGES/DRESSINGS) ×1 IMPLANT
BNDG GAUZE ELAST 4 BULKY (GAUZE/BANDAGES/DRESSINGS) IMPLANT
BNDG GZE DERMACEA 4 6PLY (GAUZE/BANDAGES/DRESSINGS) ×1
CORD BIPOLAR FORCEPS 12FT (ELECTRODE) ×1 IMPLANT
COVER SURGICAL LIGHT HANDLE (MISCELLANEOUS) ×1 IMPLANT
CUFF TOURN SGL QUICK 18X4 (TOURNIQUET CUFF) ×1 IMPLANT
DRAPE OEC MINIVIEW 54X84 (DRAPES) ×1 IMPLANT
DRAPE SURG 17X11 SM STRL (DRAPES) ×1 IMPLANT
DRILL CANN SMOOTH 2.4X110 (DRILL) ×1
DRSG ADAPTIC 3X8 NADH LF (GAUZE/BANDAGES/DRESSINGS) ×1 IMPLANT
DRSG XEROFORM 1X8 (GAUZE/BANDAGES/DRESSINGS) IMPLANT
GAUZE SPONGE 4X4 12PLY STRL (GAUZE/BANDAGES/DRESSINGS) ×1 IMPLANT
GLOVE BIOGEL PI IND STRL 8.5 (GLOVE) ×1 IMPLANT
GLOVE SURG ORTHO 8.0 STRL STRW (GLOVE) ×1 IMPLANT
GOWN STRL REUS W/ TWL LRG LVL3 (GOWN DISPOSABLE) ×3 IMPLANT
GOWN STRL REUS W/ TWL XL LVL3 (GOWN DISPOSABLE) ×1 IMPLANT
GOWN STRL REUS W/TWL LRG LVL3 (GOWN DISPOSABLE) ×3
GOWN STRL REUS W/TWL XL LVL3 (GOWN DISPOSABLE) ×1
K-WIRE FIXATION TRI 1.1X120 (WIRE) ×1
KIT BASIN OR (CUSTOM PROCEDURE TRAY) ×1 IMPLANT
KIT TURNOVER KIT B (KITS) ×1 IMPLANT
KWIRE FIXATION TRI 1.1X120 (WIRE) IMPLANT
MANIFOLD NEPTUNE II (INSTRUMENTS) ×1 IMPLANT
NAIL IM THRD 2.4X40 (Nail) IMPLANT
NDL HYPO 25X1 1.5 SAFETY (NEEDLE) ×1 IMPLANT
NEEDLE HYPO 25X1 1.5 SAFETY (NEEDLE) ×1 IMPLANT
NS IRRIG 1000ML POUR BTL (IV SOLUTION) ×1 IMPLANT
PACK ORTHO EXTREMITY (CUSTOM PROCEDURE TRAY) ×1 IMPLANT
PAD ARMBOARD 7.5X6 YLW CONV (MISCELLANEOUS) ×2 IMPLANT
PAD CAST 4YDX4 CTTN HI CHSV (CAST SUPPLIES) ×1 IMPLANT
PADDING CAST ABS COTTON 3X4 (CAST SUPPLIES) IMPLANT
PADDING CAST COTTON 4X4 STRL (CAST SUPPLIES) ×1
SOAP 2 % CHG 4 OZ (WOUND CARE) ×1 IMPLANT
SPLINT FIBERGLASS 3X12 (CAST SUPPLIES) IMPLANT
SUT MNCRL AB 4-0 PS2 18 (SUTURE) IMPLANT
SUT PROLENE 4 0 PS 2 18 (SUTURE) IMPLANT
TOWEL GREEN STERILE (TOWEL DISPOSABLE) ×1 IMPLANT
TOWEL GREEN STERILE FF (TOWEL DISPOSABLE) ×1 IMPLANT
TUBE CONNECTING 12X1/4 (SUCTIONS) ×1 IMPLANT
WATER STERILE IRR 1000ML POUR (IV SOLUTION) ×1 IMPLANT

## 2022-07-07 NOTE — Anesthesia Procedure Notes (Signed)
Anesthesia Regional Block: Supraclavicular block   Pre-Anesthetic Checklist: , timeout performed,  Correct Patient, Correct Site, Correct Laterality,  Correct Procedure, Correct Position, site marked,  Risks and benefits discussed,  Surgical consent,  Pre-op evaluation,  At surgeon's request and post-op pain management  Laterality: Left  Prep: chloraprep       Needles:  Injection technique: Single-shot  Needle Type: Echogenic Stimulator Needle     Needle Length: 9cm  Needle Gauge: 21     Additional Needles:   Procedures:,,,, ultrasound used (permanent image in chart),,    Narrative:  Start time: 07/07/2022 8:35 AM End time: 07/07/2022 8:45 AM Injection made incrementally with aspirations every 5 mL.  Performed by: Personally  Anesthesiologist: Murvin Natal, MD  Additional Notes: Functioning IV was confirmed and monitors were applied.  A timeout was performed. Sterile prep, hand hygiene and sterile gloves were used. A 44m 21ga Arrow echogenic stimulator needle was used. Negative aspiration and negative test dose prior to incremental administration of local anesthetic. The patient tolerated the procedure well.  Ultrasound guidance: relevent anatomy identified, needle position confirmed, local anesthetic spread visualized around nerve(s), vascular puncture avoided.  Image printed for medical record.

## 2022-07-07 NOTE — Transfer of Care (Signed)
Immediate Anesthesia Transfer of Care Note  Patient: Monique [name]  Procedure(s) Performed: Left small finger OPEN REDUCTION INTERNAL FIXATION (ORIF) METACARPAL and repair as indicated (Left: Finger)  Patient Location: PACU  Anesthesia Type:MAC combined with regional for post-op pain  Level of Consciousness: awake, alert , oriented and patient cooperative  Airway & Oxygen Therapy: Patient Spontanous Breathing  Post-op Assessment: Report given to RN and Post -op Vital signs reviewed and stable  Post vital signs: Reviewed and stable  Last Vitals:  Vitals Value Taken Time  BP 107/65 07/07/22 0950  Temp    Pulse 56 07/07/22 0953  Resp 13 07/07/22 0953  SpO2 97 % 07/07/22 0953  Vitals shown include unvalidated device data.  Last Pain:  Vitals:   07/07/22 0715  TempSrc: Oral  PainSc: 4          Complications: No notable events documented.

## 2022-07-07 NOTE — Op Note (Signed)
PREOPERATIVE DIAGNOSIS: Left small finger metacarpal neck fracture displaced  POSTOPERATIVE DIAGNOSIS: Same  ATTENDING SURGEON: Dr. Iran Planas who scrubbed and present for the entire procedure  ASSISTANT SURGEON: None  ANESTHESIA: Regional with IV sedation  OPERATIVE PROCEDURE: Open treatment of left small finger metacarpal neck fracture with internal fixation and intramedullary rod fixation Radiographs 3 views left hand  IMPLANTS: TriMed 2.4 mm x 40 intramedullary nail  EBL: Minimal  RADIOGRAPHIC INTERPRETATION: AP lateral oblique views of the hand do show the intramedullary fixation with good alignment in all planes  SURGICAL INDICATIONS: Patient is a right-hand-dominant female who sustained a closed injury to her left hand.  Patient was seen evaluate in the office and recommend undergo the above procedure.  Risks of surgery include but not limited to bleeding infection damage nearby nerves arteries or tendons nonunion malunion hardware failure loss of motion of the wrist and digits incomplete relief of symptoms and need for further surgical invention.  Signed informed consent was obtained the day of surgery.  SURGICAL TECHNIQUE: The patient was prepped identified in the preoperative holding area marked apart a marker made in the left hand indicate correct operative site.  Patient brought back to operating placed supine on the anesthesia table where the regional anesthetic was administered.  Patient was properly prepped and draped normal sterile fashion.  A well-padded tourniquet placed on the left brachium.  Preoperative antibiotics were given prior to skin incision.  A timeout was called the correct site was identified the procedure then begun.  Attention then turned to the left hand a longitude incision made directly over the small finger metacarpal neck.  Dissection carried down through the skin and subcutaneous tissue.  The extensor interval was carefully protected and the capsular  layer was then incised longitudinally exposing the joint and the fracture site.  Open reduction was then performed.  Following this the K wire was then placed longitudinally across the fracture site confirmed using mini C arm.  Appropriate size screw was then sized and using the 2.4 mm drill this was then placed down across the fracture site.  The appropriate size screw was then seated nicely within the metacarpal head and shaft.  The wound was then thoroughly irrigated.  Final radiographs were then obtained.  The capsule was then closed with Monocryl.  The small extension of the extensor interval was closed with Monocryl.  The skin was then closed with horizontal mattress Prolene sutures.  Adaptic dressing sterile compressive bandage then applied.  The patient was then placed in a well-padded volar splint.  Patient was then taken recovery room in good condition.  POSTOPERATIVE PLAN: Patient discharged home.  See him back in the office in 10 days for wound check suture removal x-rays done 0 therapist for a hand-based ulnar gutter splint.  Begin a postoperative ORIF of the metacarpal with intramedullary screw fixation similar to plate and screw fixation.  Radiographs at each visit.

## 2022-07-07 NOTE — Anesthesia Postprocedure Evaluation (Signed)
Anesthesia Post Note  Patient: Monique [name]  Procedure(s) Performed: Left small finger OPEN REDUCTION INTERNAL FIXATION (ORIF) METACARPAL and repair as indicated (Left: Finger)     Patient location during evaluation: PACU Anesthesia Type: Regional Level of consciousness: awake and alert Pain management: pain level controlled Vital Signs Assessment: post-procedure vital signs reviewed and stable Respiratory status: spontaneous breathing, nonlabored ventilation, respiratory function stable and patient connected to nasal cannula oxygen Cardiovascular status: stable and blood pressure returned to baseline Postop Assessment: no apparent nausea or vomiting Anesthetic complications: no   No notable events documented.  Last Vitals:  Vitals:   07/07/22 0955 07/07/22 1010  BP: 107/65 119/68  Pulse: (!) 57 60  Resp: 14 15  Temp: 36.6 C 36.6 C  SpO2: 95% 94%    Last Pain:  Vitals:   07/07/22 1010  TempSrc:   PainSc: 0-No pain                 Jocilynn Grade P Joslynn Jamroz

## 2022-07-07 NOTE — Discharge Instructions (Signed)
KEEP BANDAGE CLEAN AND DRY °CALL OFFICE FOR F/U APPT [phone] °KEEP HAND ELEVATED ABOVE HEART °OK TO APPLY ICE TO OPERATIVE AREA °CONTACT OFFICE IF ANY WORSENING PAIN OR CONCERNS. °

## 2022-07-07 NOTE — Anesthesia Preprocedure Evaluation (Signed)
Anesthesia Evaluation  Patient identified by MRN, date of birth, ID band Patient awake    Reviewed: Allergy & Precautions, NPO status , Patient's Chart, lab work & pertinent test results  Airway Mallampati: II  TM Distance: >3 FB Neck ROM: Full    Dental no notable dental hx.    Pulmonary neg pulmonary ROS,    Pulmonary exam normal        Cardiovascular hypertension, Pt. on medications and Pt. on home beta blockers Normal cardiovascular exam     Neuro/Psych negative neurological ROS     GI/Hepatic negative GI ROS, (+)     substance abuse  ,   Endo/Other  negative endocrine ROS  Renal/GU negative Renal ROS     Musculoskeletal   Abdominal   Peds  Hematology negative hematology ROS (+)   Anesthesia Other Findings Left small finger displaced metacarpal neck fracture  Reproductive/Obstetrics                             Anesthesia Physical Anesthesia Plan  ASA: 2  Anesthesia Plan: Regional   Post-op Pain Management:    Induction: Intravenous  PONV Risk Score and Plan: 2 and Ondansetron, Dexamethasone, Treatment may vary due to age or medical condition and Midazolam  Airway Management Planned: Simple Face Mask  Additional Equipment:   Intra-op Plan:   Post-operative Plan:   Informed Consent: I have reviewed the patients History and Physical, chart, labs and discussed the procedure including the risks, benefits and alternatives for the proposed anesthesia with the patient or authorized representative who has indicated his/her understanding and acceptance.     Dental advisory given  Plan Discussed with: CRNA  Anesthesia Plan Comments:         Anesthesia Quick Evaluation

## 2022-07-10 ENCOUNTER — Encounter (HOSPITAL_COMMUNITY): Payer: Self-pay | Admitting: Orthopedic Surgery

## 2022-09-19 ENCOUNTER — Other Ambulatory Visit: Payer: Self-pay | Admitting: Family Medicine

## 2022-09-19 DIAGNOSIS — Z13820 Encounter for screening for osteoporosis: Secondary | ICD-10-CM

## 2022-09-19 DIAGNOSIS — Z780 Asymptomatic menopausal state: Secondary | ICD-10-CM

## 2022-10-03 ENCOUNTER — Ambulatory Visit (INDEPENDENT_AMBULATORY_CARE_PROVIDER_SITE_OTHER): Payer: Medicare Other

## 2022-10-03 DIAGNOSIS — Z13820 Encounter for screening for osteoporosis: Secondary | ICD-10-CM

## 2022-10-03 DIAGNOSIS — Z780 Asymptomatic menopausal state: Secondary | ICD-10-CM | POA: Diagnosis not present

## 2023-01-15 ENCOUNTER — Other Ambulatory Visit: Payer: Self-pay | Admitting: Registered Nurse

## 2023-01-15 DIAGNOSIS — Z1231 Encounter for screening mammogram for malignant neoplasm of breast: Secondary | ICD-10-CM

## 2023-02-20 ENCOUNTER — Ambulatory Visit (INDEPENDENT_AMBULATORY_CARE_PROVIDER_SITE_OTHER): Payer: Medicare Other

## 2023-02-20 DIAGNOSIS — Z1231 Encounter for screening mammogram for malignant neoplasm of breast: Secondary | ICD-10-CM | POA: Diagnosis not present

## 2023-11-28 ENCOUNTER — Ambulatory Visit (AMBULATORY_SURGERY_CENTER): Payer: Medicare Other

## 2023-11-28 VITALS — Ht 65.0 in | Wt 131.0 lb

## 2023-11-28 DIAGNOSIS — Z860100 Personal history of colon polyps, unspecified: Secondary | ICD-10-CM

## 2023-11-28 MED ORDER — SUFLAVE 178.7 G PO SOLR: 1.0000 | ORAL | 0 refills | Status: DC

## 2023-11-28 NOTE — Progress Notes (Signed)

## 2023-12-23 ENCOUNTER — Encounter: Payer: Self-pay | Admitting: Pediatrics

## 2023-12-25 NOTE — Progress Notes (Unsigned)
 [facility] History and Physical   Primary Care Physician:  Maudie Flakes, FNP   Reason for Procedure:  Surveillance of adenomatous colon polyps  Plan:    Surveillance colonoscopy  HPI: Monique [name] is a 78 y.o. female undergoing surveillance colonoscopy for follow-up of a history of adenomatous colon polyps.  Patient has had 5 lifetime colonoscopies-2008, 2014, 2019 x 2, 2021.  A nonadvanced tubular adenoma was resected in 2014.  On colonoscopy in 02/2018 a 15 mm hepatic flexure polyp was resected in a piecemeal fashion-records indicate this was a difficult resection.  It showed a tubular adenoma.  Follow-up colonoscopy 04/2018 revealed residual polyp that was resected at the previous polypectomy site -also tubular adenoma on pathology.  Most recent colonoscopy in 2021 revealed a nonadvanced tubular adenoma.  Records indicate there is a pertinent family history of colorectal cancer in the patient's mother.   Past Medical History:  Diagnosis Date   Allergy    mild Runny nose only    Complication of anesthesia    Hyperlipidemia    Hypertension    Osteopenia diagnosed 2009   last DEXA July 2014     Past Surgical History:  Procedure Laterality Date   COLONOSCOPY  05/28/2018   Dr. Christella Hartigan, adenomatous polyp, repeat in 1 year    OPEN REDUCTION INTERNAL FIXATION (ORIF) METACARPAL Left 07/07/2022   Procedure: Left small finger OPEN REDUCTION INTERNAL FIXATION (ORIF) METACARPAL and repair as indicated;  Surgeon: Bradly Bienenstock, MD;  Location: [facility] OR;  Service: Orthopedics;  Laterality: Left;  75 min Regional with IV sedation   POLYPECTOMY     Tooth implant     TUBAL LIGATION     WISDOM TOOTH EXTRACTION      Prior to Admission medications   Medication Sig Start Date End Date Taking? Authorizing Provider  Calcium-Vitamin D-Vitamin K (CALCIUM SOFT CHEWS PO) Take by mouth daily. Patient not taking: Reported on 11/28/2023    [provider]  DENTA 5000 PLUS 1.1 % CREA  dental cream Take 1 Application by mouth daily. 09/02/23   [provider]  diclofenac (VOLTAREN) 75 MG EC tablet TAKE 1 TABLET BY MOUTH TWICE A DAY Patient not taking: Reported on 11/28/2023 09/29/18   Nelwyn Salisbury, MD  hydrochlorothiazide (HYDRODIURIL) 25 MG tablet TAKE 1 TABLET BY MOUTH EVERY DAY 11/10/19   Nelwyn Salisbury, MD  loperamide (IMODIUM A-D) 2 MG tablet Take 1 tablet (2 mg total) by mouth in the morning. 01/31/22   Rachael Fee, MD  losartan (COZAAR) 100 MG tablet Take 1 tablet (100 mg total) by mouth daily. Patient not taking: Reported on 11/28/2023 10/02/18   Nelwyn Salisbury, MD  methylcellulose (CITRUCEL) oral powder Take 1 packet by mouth daily. Patient not taking: Reported on 11/28/2023 02/05/22   Rachael Fee, MD  metoprolol succinate (TOPROL-XL) 50 MG 24 hr tablet TAKE 1 TABLET DAILY WITH ORIMMEDIATELY FOLLOWING A    MEAL 05/08/18   Nelwyn Salisbury, MD  Multiple Vitamin (MULTIVITAMIN) tablet Take 1 tablet by mouth daily.    [provider]  olmesartan (BENICAR) 40 MG tablet Take 40 mg by mouth daily. 04/21/20   [provider]  PEG 3350-KCl-NaCl-NaSulf-MgSul (SUFLAVE) 178.7 g SOLR Take 1 kit by mouth as directed. 11/28/23   Ottie Glazier, MD  potassium chloride (K-DUR) 10 MEQ tablet TAKE 1 TABLET DAILY Patient not taking: Reported on 11/28/2023 08/28/18   Nelwyn Salisbury, MD  potassium chloride (KLOR-CON M) 10 MEQ tablet  Take 1 tablet by mouth daily. 10/31/20   [provider]  valACYclovir (VALTREX) 500 MG tablet Take 500 mg by mouth as needed.     [provider]    Current Outpatient Medications  Medication Sig Dispense Refill   DENTA 5000 PLUS 1.1 % CREA dental cream Take 1 Application by mouth daily.     hydrochlorothiazide (HYDRODIURIL) 25 MG tablet TAKE 1 TABLET BY MOUTH EVERY DAY 90 tablet 3   metoprolol succinate (TOPROL-XL) 50 MG 24 hr tablet TAKE 1 TABLET DAILY WITH ORIMMEDIATELY FOLLOWING A    MEAL 90 tablet 1   Multiple  Vitamin (MULTIVITAMIN) tablet Take 1 tablet by mouth daily.     olmesartan (BENICAR) 40 MG tablet Take 40 mg by mouth daily.     potassium chloride (KLOR-CON M) 10 MEQ tablet Take 1 tablet by mouth daily.     Calcium-Vitamin D-Vitamin K (CALCIUM SOFT CHEWS PO) Take by mouth daily. (Patient not taking: Reported on 12/26/2023)     diclofenac (VOLTAREN) 75 MG EC tablet TAKE 1 TABLET BY MOUTH TWICE A DAY (Patient not taking: Reported on 12/26/2023) 180 tablet 3   loperamide (IMODIUM A-D) 2 MG tablet Take 1 tablet (2 mg total) by mouth in the morning. 30 tablet 0   losartan (COZAAR) 100 MG tablet Take 1 tablet (100 mg total) by mouth daily. (Patient not taking: Reported on 12/26/2023) 90 tablet 3   methylcellulose (CITRUCEL) oral powder Take 1 packet by mouth daily. (Patient not taking: Reported on 12/26/2023)     potassium chloride (K-DUR) 10 MEQ tablet TAKE 1 TABLET DAILY (Patient not taking: Reported on 12/26/2023) 90 tablet 0   valACYclovir (VALTREX) 500 MG tablet Take 500 mg by mouth as needed.      Current Facility-Administered Medications  Medication Dose Route Frequency Provider Last Rate Last Admin   0.9 %  sodium chloride infusion  500 mL Intravenous Once Piccola Arico, Durene Romans, MD        Allergies as of 12/26/2023   (No Known Allergies)    Family History  Problem Relation Age of Onset   Colon cancer Mother        dx'd in her 71's   Lung cancer Father    Prostate cancer Father    Breast cancer Maternal Aunt    Cancer Other        breast, lung, colon, prostate   Hypertension Other    Esophageal cancer Neg Hx    Liver cancer Neg Hx    Pancreatic cancer Neg Hx    Stomach cancer Neg Hx    Rectal cancer Neg Hx    Colon polyps Neg Hx     Social History   Socioeconomic History   Marital status: Married    Spouse name: Not on file   Number of children: Not on file   Years of education: Not on file   Highest education level: Not on file  Occupational History   Not on file  Tobacco  Use   Smoking status: Never   Smokeless tobacco: Never  Vaping Use   Vaping status: Never Used  Substance and Sexual Activity   Alcohol use: Yes    Alcohol/week: 7.0 standard drinks of alcohol    Types: 7 Standard drinks or equivalent per week    Comment: glass of wine each day   Drug use: No   Sexual activity: Not on file  Other Topics Concern   Not on file  Social History Narrative  Not on file   Social Drivers of Health   Financial Resource Strain: Low Risk  (10/28/2023)   Received from [facility]   Overall Financial Resource Strain (CARDIA)    Difficulty of Paying Living Expenses: Not hard at all  Food Insecurity: No Food Insecurity (10/28/2023)   Received from [facility]   Hunger Vital Sign    Worried About Running Out of Food in the Last Year: Never true    Ran Out of Food in the Last Year: Never true  Transportation Needs: No Transportation Needs (10/28/2023)   Received from [facility] - Transportation    Lack of Transportation (Medical): No    Lack of Transportation (Non-Medical): No  Physical Activity: Sufficiently Active (10/28/2023)   Received from [facility]   Exercise Vital Sign    Days of Exercise per Week: 7 days    Minutes of Exercise per Session: 40 min  Stress: No Stress Concern Present (10/28/2023)   Received from [facility] - Occupational Stress Questionnaire    Feeling of Stress : Not at all  Social Connections: Socially Integrated (10/28/2023)   Received from [facility]   Social Network    How would you rate your social network (family, work, friends)?: Good participation with social networks  Intimate Partner Violence: Not At Risk (10/28/2023)   Received from [facility]   HITS    Over the last 12 months how often did your partner physically hurt you?: Never    Over the last 12 months how often did your partner insult you or talk down to you?: Never    Over the last 12  months how often did your partner threaten you with physical harm?: Never    Over the last 12 months how often did your partner scream or curse at you?: Never    Review of Systems:  All other review of systems negative except as mentioned in the HPI.  Physical Exam: Vital signs BP (!) 152/68 (Cuff Size: Normal)   Pulse 61   Temp (!) 97.3 F (36.3 C)   Ht 5\' 5"  (1.651 m)   Wt 131 lb (59.4 kg)   SpO2 100%   BMI 21.80 kg/m   General:   Alert,  Well-developed, well-nourished, pleasant and cooperative in NAD Airway:  Mallampati 1 Lungs:  Clear throughout to auscultation.   Heart:  Regular rate and rhythm; no murmurs, clicks, rubs,  or gallops. Abdomen:  Soft, nontender and nondistended. Normal bowel sounds.   Neuro/Psych:  Normal mood and affect. A and O x 3  Maren Beach, MD [facility]

## 2023-12-26 ENCOUNTER — Ambulatory Visit (AMBULATORY_SURGERY_CENTER): Payer: No Typology Code available for payment source | Admitting: Pediatrics

## 2023-12-26 ENCOUNTER — Encounter: Payer: Self-pay | Admitting: Pediatrics

## 2023-12-26 VITALS — BP 157/68 | HR 52 | Temp 97.3°F | Resp 9 | Ht 65.0 in | Wt 131.0 lb

## 2023-12-26 DIAGNOSIS — K648 Other hemorrhoids: Secondary | ICD-10-CM | POA: Diagnosis not present

## 2023-12-26 DIAGNOSIS — Z800 Family history of malignant neoplasm of digestive organs: Secondary | ICD-10-CM

## 2023-12-26 DIAGNOSIS — D122 Benign neoplasm of ascending colon: Secondary | ICD-10-CM

## 2023-12-26 DIAGNOSIS — Z1211 Encounter for screening for malignant neoplasm of colon: Secondary | ICD-10-CM

## 2023-12-26 DIAGNOSIS — D123 Benign neoplasm of transverse colon: Secondary | ICD-10-CM | POA: Diagnosis not present

## 2023-12-26 DIAGNOSIS — K644 Residual hemorrhoidal skin tags: Secondary | ICD-10-CM | POA: Diagnosis not present

## 2023-12-26 DIAGNOSIS — K6289 Other specified diseases of anus and rectum: Secondary | ICD-10-CM

## 2023-12-26 DIAGNOSIS — Z860100 Personal history of colon polyps, unspecified: Secondary | ICD-10-CM

## 2023-12-26 MED ORDER — SODIUM CHLORIDE 0.9 % IV SOLN: 500.0000 mL | Freq: Once | INTRAVENOUS | Status: DC

## 2023-12-26 NOTE — Progress Notes (Signed)
 Pt's states no medical or surgical changes since previsit or office visit.

## 2023-12-26 NOTE — Op Note (Addendum)
 [facility] Patient Name: Monique [name] Procedure Date: 12/26/2023 10:24 AM MRN: [id number] Endoscopist: Maren Beach , MD, [id number] Age: 78 Referring MD:  Date of Birth: 04-Jan-1946 Gender: Female Account #: [id number] Procedure:                Colonoscopy Indications:              High risk colon cancer surveillance: Personal                            history of adenoma (10 mm or greater in size), Last                            colonoscopy: July 2021, Family history of colon                            cancer - mother age 74 Medicines:                Monitored Anesthesia Care Procedure:                Pre-Anesthesia Assessment:                           - Prior to the procedure, a History and Physical                            was performed, and patient medications and                            allergies were reviewed. The patient's tolerance of                            previous anesthesia was also reviewed. The risks                            and benefits of the procedure and the sedation                            options and risks were discussed with the patient.                            All questions were answered, and informed consent                            was obtained. Prior Anticoagulants: The patient has                            taken no anticoagulant or antiplatelet agents. ASA                            Grade Assessment: II - A patient with mild systemic                            disease. After reviewing the risks and benefits,  the patient was deemed in satisfactory condition to                            undergo the procedure.                           After obtaining informed consent, the colonoscope                            was passed under direct vision. Throughout the                            procedure, the patient's blood pressure, pulse, and                            oxygen saturations were monitored  continuously. The                            Olympus Scope SN: [id number] was introduced through                            the anus and advanced to the cecum, identified by                            appendiceal orifice and ileocecal valve. The                            colonoscopy was performed without difficulty. The                            patient tolerated the procedure well. The quality                            of the bowel preparation was good. The ileocecal                            valve, appendiceal orifice, and rectum were                            photographed. Scope In: 10:37:59 AM Scope Out: 10:57:52 AM Scope Withdrawal Time: 0 hours 14 minutes 49 seconds  Total Procedure Duration: 0 hours 19 minutes 53 seconds  Findings:                 Skin tags were found on perianal exam.                           The digital rectal exam was normal. Pertinent                            negatives include normal sphincter tone and no                            palpable rectal lesions.  A tattoo was seen at the hepatic flexure.                           A 7 mm polyp was found in the hepatic flexure. The                            polyp was flat. The polyp was removed with a cold                            snare. Resection and retrieval were complete.                           A 4 mm polyp was found in the transverse colon. The                            polyp was sessile. The polyp was removed with a                            cold biopsy forceps. Resection and retrieval were                            complete.                           Anal papilla(e) were hypertrophied.                           Internal hemorrhoids were found during retroflexion. Complications:            No immediate complications. Estimated blood loss:                            Minimal. Estimated Blood Loss:     Estimated blood loss was minimal. Impression:               - Perianal  skin tags found on perianal exam.                           - A tattoo was seen at the hepatic flexure.                           - One 7 mm polyp at the hepatic flexure, removed                            with a cold snare. Resected and retrieved.                           - One 4 mm polyp in the transverse colon, removed                            with a cold biopsy forceps. Resected and retrieved.                           - Anal papilla(e) were  hypertrophied.                           - Internal hemorrhoids. Recommendation:           - Discharge patient to home (ambulatory).                           - Await pathology results.                           - Patient is now 78 years of age. Recommendation                            for PCP discussed risk/benefits of ongoing colon                            polyp surveillance in light of family history and                            patient medical comorbidities. Decisions regarding                            surveillance between ages 68 to 54 years of age are                            individualized based upon patient risk, health and                            life expectancy.                           - The findings and recommendations were discussed                            with the patient's family.                           - Return to referring physician.                           - Patient has a contact number available for                            emergencies. The signs and symptoms of potential                            delayed complications were discussed with the                            patient. Return to normal activities tomorrow.                            Written discharge instructions were provided to the  patient. Maren Beach, MD 12/26/2023 11:06:04 AM This report has been signed electronically. Addendum Number: 1   Addendum Date: 12/26/2023 11:55:01 AM      4 mm polyp was removed from  the ascending colon and not the transverse       colon. Maren Beach, MD 12/26/2023 11:55:51 AM This report has been signed electronically.

## 2023-12-26 NOTE — Progress Notes (Signed)
 Sedate, gd SR, tolerated procedure well, VSS, report to RN

## 2023-12-26 NOTE — Patient Instructions (Signed)
Resume previous diet and medications. Awaiting pathology results.  Handouts provided [id number] polyps and Hemorrhoids.  YOU HAD AN ENDOSCOPIC PROCEDURE TODAY AT THE [facility]:   Refer to the procedure report that was given to you for any specific questions about what was found during the examination.  If the procedure report does not answer your questions, please call your gastroenterologist to clarify.  If you requested that your care partner not be given the details of your procedure findings, then the procedure report has been included in a sealed envelope for you to review at your convenience later.  YOU SHOULD EXPECT: Some feelings of bloating in the abdomen. Passage of more gas than usual.  Walking can help get rid of the air that was put into your GI tract during the procedure and reduce the bloating. If you had a lower endoscopy (such as a colonoscopy or flexible sigmoidoscopy) you may notice spotting of blood in your stool or on the toilet paper. If you underwent a bowel prep for your procedure, you may not have a normal bowel movement for a few days.  Please Note:  You might notice some irritation and congestion in your nose or some drainage.  This is from the oxygen used during your procedure.  There is no need for concern and it should clear up in a day or so.  SYMPTOMS TO REPORT IMMEDIATELY:  Following lower endoscopy (colonoscopy or flexible sigmoidoscopy):  Excessive amounts of blood in the stool  Significant tenderness or worsening of abdominal pains  Swelling of the abdomen that is new, acute  Fever of 100F or higher  For urgent or emergent issues, a gastroenterologist can be reached at any hour by calling [phone]. Do not use MyChart messaging for urgent concerns.    DIET:  We do recommend a small meal at first, but then you may proceed to your regular diet.  Drink plenty of fluids but you should avoid alcoholic beverages for 24 hours.  ACTIVITY:  You  should plan to take it easy for the rest of today and you should NOT DRIVE or use heavy machinery until tomorrow (because of the sedation medicines used during the test).    FOLLOW UP: Our staff will call the number listed on your records the next business day following your procedure.  We will call around 7:15- 8:00 am to check on you and address any questions or concerns that you may have regarding the information given to you following your procedure. If we do not reach you, we will leave a message.     If any biopsies were taken you will be contacted by phone or by letter within the next 1-3 weeks.  Please call us at [phone] if you have not heard about the biopsies in 3 weeks.    SIGNATURES/CONFIDENTIALITY: You and/or your care partner have signed paperwork which will be entered into your electronic medical record.  These signatures attest to the fact that that the information above on your After Visit Summary has been reviewed and is understood.  Full responsibility of the confidentiality of this discharge information lies with you and/or your care-partner.

## 2023-12-26 NOTE — Progress Notes (Signed)
 Called to room to assist during endoscopic procedure.  Patient ID and intended procedure confirmed with present staff. Received instructions for my participation in the procedure from the performing physician.

## 2023-12-27 ENCOUNTER — Telehealth: Payer: Self-pay

## 2023-12-27 NOTE — Telephone Encounter (Signed)
  Follow up Call-     12/26/2023    9:59 AM  Call back number  Post procedure Call Back phone  # [phone]  Permission to leave phone message Yes     Patient questions:  Do you have a fever, pain , or abdominal swelling? No. Pain Score  0 *  Have you tolerated food without any problems? Yes.    Have you been able to return to your normal activities? Yes.    Do you have any questions about your discharge instructions: Diet   No. Medications  No. Follow up visit  No.  Do you have questions or concerns about your Care? No.  Actions: * If pain score is 4 or above: No action needed, pain <4.

## 2023-12-30 ENCOUNTER — Encounter: Payer: Self-pay | Admitting: Pediatrics

## 2023-12-30 LAB — SURGICAL PATHOLOGY
